# Patient Record
Sex: Female | Born: 1965
Health system: Southern US, Community
[De-identification: ages and names within clinical notes are randomized; demographics above are authoritative.]

## PROBLEM LIST (undated history)

## (undated) DIAGNOSIS — I10 Essential (primary) hypertension: Secondary | ICD-10-CM

## (undated) DIAGNOSIS — A809 Acute poliomyelitis, unspecified: Secondary | ICD-10-CM

## (undated) HISTORY — PX: LEG SURGERY: SHX1003

## (undated) HISTORY — PX: WRIST SURGERY: SHX841

---

## 2001-11-05 ENCOUNTER — Other Ambulatory Visit: Admission: RE | Admit: 2001-11-05 | Discharge: 2001-11-05 | Payer: Self-pay | Admitting: Internal Medicine

## 2005-10-18 ENCOUNTER — Ambulatory Visit: Payer: Self-pay | Admitting: Internal Medicine

## 2005-10-20 ENCOUNTER — Encounter: Admission: RE | Admit: 2005-10-20 | Discharge: 2005-10-20 | Payer: Self-pay | Admitting: Internal Medicine

## 2005-11-01 ENCOUNTER — Encounter: Admission: RE | Admit: 2005-11-01 | Discharge: 2005-11-01 | Payer: Self-pay | Admitting: Internal Medicine

## 2005-11-29 ENCOUNTER — Ambulatory Visit: Payer: Self-pay | Admitting: Internal Medicine

## 2005-11-29 ENCOUNTER — Other Ambulatory Visit: Admission: RE | Admit: 2005-11-29 | Discharge: 2005-11-29 | Payer: Self-pay | Admitting: Internal Medicine

## 2005-11-29 ENCOUNTER — Encounter: Payer: Self-pay | Admitting: Internal Medicine

## 2006-05-04 ENCOUNTER — Encounter: Admission: RE | Admit: 2006-05-04 | Discharge: 2006-05-04 | Payer: Self-pay | Admitting: Family Medicine

## 2006-07-28 ENCOUNTER — Ambulatory Visit: Payer: Self-pay | Admitting: Internal Medicine

## 2006-08-04 ENCOUNTER — Ambulatory Visit (HOSPITAL_COMMUNITY): Admission: RE | Admit: 2006-08-04 | Discharge: 2006-08-04 | Payer: Self-pay | Admitting: Internal Medicine

## 2006-09-15 ENCOUNTER — Inpatient Hospital Stay (HOSPITAL_COMMUNITY): Admission: EM | Admit: 2006-09-15 | Discharge: 2006-09-16 | Payer: Self-pay | Admitting: *Deleted

## 2006-10-06 ENCOUNTER — Ambulatory Visit: Payer: Self-pay | Admitting: Internal Medicine

## 2006-10-23 ENCOUNTER — Encounter: Admission: RE | Admit: 2006-10-23 | Discharge: 2006-10-23 | Payer: Self-pay | Admitting: Family Medicine

## 2007-01-05 ENCOUNTER — Ambulatory Visit: Payer: Self-pay | Admitting: Internal Medicine

## 2007-01-05 LAB — CONVERTED CEMR LAB
Cholesterol: 223 mg/dL — ABNORMAL HIGH (ref 0–200)
LDL Cholesterol: 137 mg/dL — ABNORMAL HIGH (ref 0–99)
TSH: 1.544 microintl units/mL (ref 0.350–5.50)
Triglycerides: 51 mg/dL (ref <150)
VLDL: 10 mg/dL (ref 0–40)

## 2007-01-17 ENCOUNTER — Ambulatory Visit: Payer: Self-pay | Admitting: Internal Medicine

## 2007-01-29 ENCOUNTER — Ambulatory Visit: Payer: Self-pay | Admitting: Internal Medicine

## 2007-01-29 ENCOUNTER — Encounter: Payer: Self-pay | Admitting: Internal Medicine

## 2007-01-29 LAB — CONVERTED CEMR LAB: Chlamydia, Swab/Urine, PCR: NEGATIVE

## 2007-04-08 ENCOUNTER — Emergency Department (HOSPITAL_COMMUNITY): Admission: EM | Admit: 2007-04-08 | Discharge: 2007-04-08 | Payer: Self-pay | Admitting: Emergency Medicine

## 2007-05-29 ENCOUNTER — Encounter: Admission: RE | Admit: 2007-05-29 | Discharge: 2007-05-29 | Payer: Self-pay | Admitting: Internal Medicine

## 2007-10-31 ENCOUNTER — Encounter: Admission: RE | Admit: 2007-10-31 | Discharge: 2007-10-31 | Payer: Self-pay | Admitting: Internal Medicine

## 2008-01-23 ENCOUNTER — Other Ambulatory Visit: Admission: RE | Admit: 2008-01-23 | Discharge: 2008-01-23 | Payer: Self-pay | Admitting: Internal Medicine

## 2008-01-23 ENCOUNTER — Ambulatory Visit: Payer: Self-pay | Admitting: Internal Medicine

## 2008-01-23 ENCOUNTER — Encounter: Payer: Self-pay | Admitting: Internal Medicine

## 2008-03-04 ENCOUNTER — Ambulatory Visit: Payer: Self-pay | Admitting: Internal Medicine

## 2008-10-31 ENCOUNTER — Encounter: Admission: RE | Admit: 2008-10-31 | Discharge: 2008-10-31 | Payer: Self-pay | Admitting: Internal Medicine

## 2009-03-23 ENCOUNTER — Ambulatory Visit: Payer: Self-pay | Admitting: Internal Medicine

## 2009-03-23 ENCOUNTER — Other Ambulatory Visit: Admission: RE | Admit: 2009-03-23 | Discharge: 2009-03-23 | Payer: Self-pay | Admitting: Internal Medicine

## 2009-03-24 ENCOUNTER — Encounter (INDEPENDENT_AMBULATORY_CARE_PROVIDER_SITE_OTHER): Payer: Self-pay | Admitting: Internal Medicine

## 2009-04-02 ENCOUNTER — Encounter (INDEPENDENT_AMBULATORY_CARE_PROVIDER_SITE_OTHER): Payer: Self-pay | Admitting: *Deleted

## 2009-09-02 ENCOUNTER — Ambulatory Visit: Payer: Self-pay | Admitting: Internal Medicine

## 2009-11-02 ENCOUNTER — Encounter: Admission: RE | Admit: 2009-11-02 | Discharge: 2009-11-02 | Payer: Self-pay | Admitting: Internal Medicine

## 2010-02-24 ENCOUNTER — Encounter (INDEPENDENT_AMBULATORY_CARE_PROVIDER_SITE_OTHER): Payer: Self-pay | Admitting: Internal Medicine

## 2010-02-24 ENCOUNTER — Other Ambulatory Visit: Admission: RE | Admit: 2010-02-24 | Discharge: 2010-02-24 | Payer: Self-pay | Admitting: Internal Medicine

## 2010-02-24 LAB — CONVERTED CEMR LAB
BUN: 17 mg/dL (ref 6–23)
CO2: 23 meq/L (ref 19–32)
Calcium: 9.2 mg/dL (ref 8.4–10.5)
Chloride: 105 meq/L (ref 96–112)
Creatinine, Ser: 0.63 mg/dL (ref 0.40–1.20)
Glucose, Bld: 98 mg/dL (ref 70–99)
Potassium: 4 meq/L (ref 3.5–5.3)

## 2010-08-24 NOTE — H&P (Signed)
NAME:  Krystal Li, Krystal Li NO.:  [id number]   MEDICAL RECORD NO.:  [id number]          PATIENT TYPE:  EMS   LOCATION:  [facility]                         FACILITY:  [facility]   PHYSICIAN:  Elliot Cousin, M.D.    DATE OF BIRTH:  1965-07-29   DATE OF ADMISSION:  09/14/2006  DATE OF DISCHARGE:                              HISTORY & PHYSICAL   PRIMARY CARE PHYSICIAN:  Dineen Kid. Reche Dixon, M.D., [facility].   CHIEF COMPLAINT:  Abdominal pain, nausea and vomiting.   HISTORY OF PRESENT ILLNESS:  The patient is a 45 year old woman with a  past medical history significant for polio, who presents to the  emergency department with a chief complaint of abdominal pain, nausea,  and vomiting.  The abdominal pain started approximately three days ago.  It is located primarily in the lower abdomen.  Initially, the pain was  severe; however, over the past 2-3 days, the pain has subsided but not  completely resolved.  She has had worsening nausea and vomiting over the  past 24 hours, numbering approximately 15 episodes.  She denies coffee-  ground emesis or bright red blood in her emesis.  She has had some  generalized weakness but no focal weakness worse than usual.  She denies  diarrhea and constipation.  She denies bright red blood per rectum or  black, tarry stools.  Her last bowel movement was two days ago and was  considered normal.  She has had subjective fever and chills.  She  denies painful urination and vaginal discharge.  Her last menstrual  period was approximately four days ago.  She is not sexually active on a  consistent basis.  She has not had any recent travel.  No recent  antibiotic treatment.  She denies any known sick contacts.   When the patient arrived to the emergency department, her heart rate was  in the 150s.  She received at least a liter of IV fluids, and now her  heart rate is ranging between 100-115 beats per minute.  Her lab data  are unremarkable at this  time; however, she will be admitted for further  evaluation and management.   PAST MEDICAL HISTORY:  1. Polio.  2. Headache.  3. Status post C-section.   MEDICATIONS:  1. Diclofenac 1 tablet b.i.d. p.r.n.  2. Midol 1 tablet as directed and as needed for menstrual cramps.  3. BC powders occasionally.   ALLERGIES:  No known drug allergies.   SOCIAL HISTORY:  The patient is single.  She has one child who is in  foster care.  She is totally disabled.  She ambulates with a cane.  She  denies tobacco, alcohol, and illicit drug use.   FAMILY HISTORY:  Her mother is 30 years of age and has a history of a  stroke and hypertension.  Her father is deceased, etiology unknown.  Her  brothers have a history of hypertension.   REVIEW OF SYSTEMS:  Patient's review of systems is positive for chronic  weakness in her legs, headaches, and menstrual cramps.   PHYSICAL EXAMINATION:  VITAL SIGNS:  Temperature 98.5, blood pressure  125/82, pulse rate 114, respiratory rate 18.  Oxygen saturation 99% on  room air.  GENERAL:  The patient is a pleasant 45 year old African-American woman  who is currently lying in bed in no acute distress.  HEENT:  Head is normocephalic and atraumatic.  Pupils are equal, round  and reactive to light.  Extraocular movements are intact.  Conjunctivae  are clear.  There is evidence of strabismus.  She also has mild  proptosis bilaterally.  Tympanic membranes are clear bilaterally.  Nasal  mucosa is mildly dry.  No sinus tenderness.  Oropharynx reveals fair  dentition.  Mucous membranes are mildly dry.  No posterior exudates or  erythema.  NECK:  Supple.  No adenopathy.  No thyromegaly.  No bruits.  No JVD.  LUNGS:  Clear to auscultation bilaterally.  HEART:  S1 and S2 with tachycardia.  ABDOMEN:  Hypoactive bowel sounds.  Mildly obese.  Only mildly tender in  the hypogastrium without rebound, guarding, or distention.  No masses  palpated.  No hepatosplenomegaly.   RECTAL/GU:  Deferred.  EXTREMITIES:  Pedal pulses 2+ bilaterally.  Mild atrophy of her lower  extremities bilaterally.  NEUROLOGIC:  Patient is alert and oriented x3.  Cranial nerves II-XII  appear to be grossly intact with the exception of lateral deviation of  the left eye, which is chronic.  Strength is 5/5 in the supine position  throughout.  Sensation is intact.   ADMISSION LABORATORIES:  Sodium 136, potassium 3.8, chloride 108,  glucose 134, BUN 25, bicarbonate 17, creatinine 1.  WBC 5.7, hemoglobin  15.9, hematocrit 47.5, platelets 416.  Urine pregnancy test negative.  Urinalysis:  Ketones 40, bilirubin moderate, nitrite negative, leukocyte  esterase negative.   ASSESSMENT:  1. Abdominal pain with intractable nausea and vomiting.  On exam, the      patient has only minimal abdominal tenderness with no associated      distention or peritonitis.  The nausea and vomiting appear to be      the most concerning symptoms for the patient.  The patient probably      has an acute bout of gastroenteritis.  2. Metabolic acidosis:  The patient's bicarbonate was found to be 17.      Given the patient's nausea and vomiting, metabolic alkalosis would      be expected.  Her creatinine and BUN are only marginally elevated.      She has not had any diarrhea, by her account.  The etiology of the      metabolic acidosis is unclear at this time.  3. Tachycardia:  The patient's tachycardia appears to be sinus.  An      EKG is pending.  With IV fluids, the tachycardia has improved.   PLAN:  1. Patient will be admitted for further evaluation and management.  2. Will check an abdominal x-ray, lipase, amylase, and lactic acid      level.  3. Supportive care with antiemetics, p.r.n. Zofran and Phenergan, and      scheduled Reglan.  4. Will start IV fluids with bicarbonate added.  5. Will check an EKG and TSH.  6. Will check liver transaminases as well.      Elliot Cousin, M.D.  Electronically  Signed     DF/MEDQ  D:  09/15/2006  T:  09/15/2006  Job:  [id number]   cc:   Dineen Kid. Reche Dixon, M.D.

## 2010-09-27 ENCOUNTER — Other Ambulatory Visit: Payer: Self-pay | Admitting: Internal Medicine

## 2010-09-27 DIAGNOSIS — Z1231 Encounter for screening mammogram for malignant neoplasm of breast: Secondary | ICD-10-CM

## 2010-11-05 ENCOUNTER — Ambulatory Visit
Admission: RE | Admit: 2010-11-05 | Discharge: 2010-11-05 | Disposition: A | Discharge status: Home / Self Care | Payer: Medicare Other | Source: Ambulatory Visit | Attending: Internal Medicine | Admitting: Internal Medicine

## 2010-11-05 DIAGNOSIS — Z1231 Encounter for screening mammogram for malignant neoplasm of breast: Secondary | ICD-10-CM

## 2011-01-27 LAB — URINE MICROSCOPIC-ADD ON

## 2011-01-27 LAB — PROTIME-INR
INR: 1.1
Prothrombin Time: 14.6

## 2011-01-27 LAB — DIFFERENTIAL
Eosinophils Absolute: 0
Lymphocytes Relative: 28
Lymphs Abs: 1.6
Lymphs Abs: 2.1
Monocytes Absolute: 0.6
Monocytes Absolute: 0.6
Monocytes Relative: 11
Monocytes Relative: 11
Neutro Abs: 3
Neutro Abs: 3.4
Neutrophils Relative %: 51
Neutrophils Relative %: 59

## 2011-01-27 LAB — URINALYSIS, ROUTINE W REFLEX MICROSCOPIC
Glucose, UA: NEGATIVE
Ketones, ur: 40 — AB
Protein, ur: 30 — AB
Urobilinogen, UA: 1

## 2011-01-27 LAB — COMPREHENSIVE METABOLIC PANEL
ALT: 25
Albumin: 3.8
Calcium: 8.9
Glucose, Bld: 124 — ABNORMAL HIGH
Sodium: 137
Total Protein: 6.9

## 2011-01-27 LAB — AMYLASE: Amylase: 68

## 2011-01-27 LAB — BASIC METABOLIC PANEL
BUN: 2 — ABNORMAL LOW
Calcium: 8.3 — ABNORMAL LOW
Creatinine, Ser: 0.61
GFR calc non Af Amer: >60

## 2011-01-27 LAB — I-STAT 8, (EC8 V) (CONVERTED LAB)
Acid-base deficit: 8 — ABNORMAL HIGH
Chloride: 108
HCT: 50 — ABNORMAL HIGH
Hemoglobin: 17 — ABNORMAL HIGH
Potassium: 3.8
Sodium: 136
TCO2: 18
pH, Ven: 7.323 — ABNORMAL HIGH

## 2011-01-27 LAB — CBC
Hemoglobin: 13.9
MCHC: 33.8
MCV: 95.7
Platelets: 243
Platelets: 345
RBC: 4.96
RDW: 12.4
WBC: 4

## 2011-01-27 LAB — PREGNANCY, URINE: Preg Test, Ur: NEGATIVE

## 2011-01-27 LAB — TSH: TSH: 2.835

## 2011-01-27 LAB — POCT I-STAT CREATININE: Operator id: 265961

## 2011-01-27 LAB — LACTIC ACID, PLASMA: Lactic Acid, Venous: 1

## 2011-05-01 ENCOUNTER — Other Ambulatory Visit (HOSPITAL_COMMUNITY)
Admission: RE | Admit: 2011-05-01 | Discharge: 2011-05-01 | Disposition: A | Discharge status: Home / Self Care | Payer: Medicare Other | Source: Ambulatory Visit | Attending: Family Medicine | Admitting: Family Medicine

## 2011-05-01 DIAGNOSIS — Z01419 Encounter for gynecological examination (general) (routine) without abnormal findings: Secondary | ICD-10-CM | POA: Diagnosis not present

## 2011-05-11 ENCOUNTER — Other Ambulatory Visit: Payer: Self-pay | Admitting: Internal Medicine

## 2011-05-11 DIAGNOSIS — M129 Arthropathy, unspecified: Secondary | ICD-10-CM | POA: Diagnosis not present

## 2011-05-11 DIAGNOSIS — I10 Essential (primary) hypertension: Secondary | ICD-10-CM | POA: Diagnosis not present

## 2011-05-12 ENCOUNTER — Other Ambulatory Visit (HOSPITAL_COMMUNITY): Payer: Self-pay | Admitting: Family Medicine

## 2011-05-12 DIAGNOSIS — Z1231 Encounter for screening mammogram for malignant neoplasm of breast: Secondary | ICD-10-CM

## 2011-06-09 DIAGNOSIS — I10 Essential (primary) hypertension: Secondary | ICD-10-CM | POA: Diagnosis not present

## 2011-09-21 DIAGNOSIS — B91 Sequelae of poliomyelitis: Secondary | ICD-10-CM | POA: Diagnosis not present

## 2011-10-14 DIAGNOSIS — I10 Essential (primary) hypertension: Secondary | ICD-10-CM | POA: Diagnosis not present

## 2011-10-14 DIAGNOSIS — Z7251 High risk heterosexual behavior: Secondary | ICD-10-CM | POA: Diagnosis not present

## 2011-10-14 DIAGNOSIS — N9489 Other specified conditions associated with female genital organs and menstrual cycle: Secondary | ICD-10-CM | POA: Diagnosis not present

## 2011-10-14 DIAGNOSIS — B91 Sequelae of poliomyelitis: Secondary | ICD-10-CM | POA: Diagnosis not present

## 2011-11-07 ENCOUNTER — Ambulatory Visit (HOSPITAL_COMMUNITY)
Admission: RE | Admit: 2011-11-07 | Discharge: 2011-11-07 | Disposition: A | Discharge status: Home / Self Care | Payer: Medicare Other | Source: Ambulatory Visit | Attending: Family Medicine | Admitting: Family Medicine

## 2011-11-07 DIAGNOSIS — Z1231 Encounter for screening mammogram for malignant neoplasm of breast: Secondary | ICD-10-CM

## 2012-01-31 DIAGNOSIS — E559 Vitamin D deficiency, unspecified: Secondary | ICD-10-CM | POA: Diagnosis not present

## 2012-01-31 DIAGNOSIS — B91 Sequelae of poliomyelitis: Secondary | ICD-10-CM | POA: Diagnosis not present

## 2012-06-07 ENCOUNTER — Emergency Department (HOSPITAL_COMMUNITY): Payer: Medicare Other

## 2012-06-07 ENCOUNTER — Encounter (HOSPITAL_COMMUNITY): Payer: Self-pay | Admitting: Emergency Medicine

## 2012-06-07 ENCOUNTER — Emergency Department (HOSPITAL_COMMUNITY)
Admission: EM | Admit: 2012-06-07 | Discharge: 2012-06-08 | Disposition: A | Discharge status: Home / Self Care | Payer: Medicare Other | Attending: Emergency Medicine | Admitting: Emergency Medicine

## 2012-06-07 DIAGNOSIS — R112 Nausea with vomiting, unspecified: Secondary | ICD-10-CM | POA: Insufficient documentation

## 2012-06-07 DIAGNOSIS — J3489 Other specified disorders of nose and nasal sinuses: Secondary | ICD-10-CM | POA: Insufficient documentation

## 2012-06-07 DIAGNOSIS — Z79899 Other long term (current) drug therapy: Secondary | ICD-10-CM | POA: Insufficient documentation

## 2012-06-07 DIAGNOSIS — R6889 Other general symptoms and signs: Secondary | ICD-10-CM | POA: Insufficient documentation

## 2012-06-07 DIAGNOSIS — Z8619 Personal history of other infectious and parasitic diseases: Secondary | ICD-10-CM | POA: Insufficient documentation

## 2012-06-07 DIAGNOSIS — R05 Cough: Secondary | ICD-10-CM | POA: Insufficient documentation

## 2012-06-07 DIAGNOSIS — R109 Unspecified abdominal pain: Secondary | ICD-10-CM | POA: Insufficient documentation

## 2012-06-07 DIAGNOSIS — R197 Diarrhea, unspecified: Secondary | ICD-10-CM | POA: Diagnosis not present

## 2012-06-07 DIAGNOSIS — E860 Dehydration: Secondary | ICD-10-CM | POA: Diagnosis not present

## 2012-06-07 DIAGNOSIS — R059 Cough, unspecified: Secondary | ICD-10-CM | POA: Diagnosis not present

## 2012-06-07 DIAGNOSIS — I10 Essential (primary) hypertension: Secondary | ICD-10-CM | POA: Diagnosis not present

## 2012-06-07 DIAGNOSIS — R1110 Vomiting, unspecified: Secondary | ICD-10-CM

## 2012-06-07 HISTORY — DX: Essential (primary) hypertension: I10

## 2012-06-07 HISTORY — DX: Acute poliomyelitis, unspecified: A80.9

## 2012-06-07 LAB — URINE MICROSCOPIC-ADD ON

## 2012-06-07 LAB — COMPREHENSIVE METABOLIC PANEL
ALT: 55 U/L — ABNORMAL HIGH (ref 0–35)
Albumin: 4.1 g/dL (ref 3.5–5.2)
Alkaline Phosphatase: 117 U/L (ref 39–117)
BUN: 15 mg/dL (ref 6–23)
Calcium: 9.6 mg/dL (ref 8.4–10.5)
GFR calc Af Amer: >90 mL/min (ref >90)
Glucose, Bld: 159 mg/dL — ABNORMAL HIGH (ref 70–99)
Potassium: 3.5 mEq/L (ref 3.5–5.1)
Sodium: 135 mEq/L (ref 135–145)
Total Protein: 7.8 g/dL (ref 6.0–8.3)

## 2012-06-07 LAB — CBC WITH DIFFERENTIAL/PLATELET
Basophils Relative: 1 % (ref 0–1)
Eosinophils Absolute: 0.1 10*3/uL (ref 0.0–0.7)
Eosinophils Relative: 1 % (ref 0–5)
HCT: 39.4 % (ref 36.0–46.0)
Hemoglobin: 13.9 g/dL (ref 12.0–15.0)
Lymphs Abs: 1.4 10*3/uL (ref 0.7–4.0)
MCH: 32.2 pg (ref 26.0–34.0)
MCHC: 35.3 g/dL (ref 30.0–36.0)
MCV: 91.2 fL (ref 78.0–100.0)
Neutrophils Relative %: 65 % (ref 43–77)
Platelets: 303 10*3/uL (ref 150–400)
RBC: 4.32 MIL/uL (ref 3.87–5.11)
WBC: 5.4 10*3/uL (ref 4.0–10.5)

## 2012-06-07 LAB — URINALYSIS, ROUTINE W REFLEX MICROSCOPIC
Glucose, UA: NEGATIVE mg/dL
Leukocytes, UA: NEGATIVE
Nitrite: NEGATIVE
Specific Gravity, Urine: 1.03 (ref 1.005–1.030)
pH: 5 (ref 5.0–8.0)

## 2012-06-07 MED ORDER — METOCLOPRAMIDE HCL 10 MG PO TABS: 10.0000 mg | ORAL_TABLET | Freq: Four times a day (QID) | ORAL | Status: AC | PRN

## 2012-06-07 MED ORDER — METOCLOPRAMIDE HCL 5 MG/ML IJ SOLN
10.0000 mg | Freq: Once | INTRAMUSCULAR | Status: AC
  Administered 2012-06-07: 10 mg via INTRAVENOUS
  Filled 2012-06-07: qty 2

## 2012-06-07 MED ORDER — SODIUM CHLORIDE 0.9 % IV BOLUS (SEPSIS)
2000.0000 mL | Freq: Once | INTRAVENOUS | Status: AC
Start: 2012-06-07 — End: 2012-06-08
  Administered 2012-06-07: 2000 mL via INTRAVENOUS

## 2012-06-07 MED ORDER — IPRATROPIUM BROMIDE 0.02 % IN SOLN
0.5000 mg | Freq: Once | RESPIRATORY_TRACT | Status: AC
  Administered 2012-06-07: 0.5 mg via RESPIRATORY_TRACT
  Filled 2012-06-07: qty 2.5

## 2012-06-07 MED ORDER — ALBUTEROL SULFATE HFA 108 (90 BASE) MCG/ACT IN AERS: 1.0000 | INHALATION_SPRAY | Freq: Four times a day (QID) | RESPIRATORY_TRACT | Status: AC | PRN

## 2012-06-07 MED ORDER — ALBUTEROL SULFATE (5 MG/ML) 0.5% IN NEBU
5.0000 mg | INHALATION_SOLUTION | Freq: Once | RESPIRATORY_TRACT | Status: AC
  Administered 2012-06-07: 5 mg via RESPIRATORY_TRACT
  Filled 2012-06-07: qty 1

## 2012-06-07 NOTE — ED Provider Notes (Signed)
History     CSN: [id number]  Arrival date & time 06/07/12  2145   First MD Initiated Contact with Patient 06/07/12 2232      Chief Complaint  Patient presents with  . Emesis  . Cough    (Consider location/radiation/quality/duration/timing/severity/associated sxs/prior treatment) The history is provided by the patient.  Krystal Li is a 47 y.o. female history of hypertension here presenting with abdominal pain and nausea vomiting and diarrhea. Diarrhea for the last 2 weeks. For the last week she has intermittent nausea and vomiting. She ate some french fries yesterday and will vomited up. She also has some nonproductive cough as well as nasal congestion and runny nose. Denies any chest pain or shortness of breath. She just finished her menstrual period.    Past Medical History  Diagnosis Date  . Hypertension   . Polio     Past Surgical History  Procedure Laterality Date  . Leg surgery      No family history on file.  History  Substance Use Topics  . Smoking status: Never Smoker   . Smokeless tobacco: Not on file  . Alcohol Use: No    OB History   Grav Para Term Preterm Abortions TAB SAB Ect Mult Living                  Review of Systems  Respiratory: Positive for cough.   Gastrointestinal: Positive for vomiting and abdominal pain.  All other systems reviewed and are negative.    Allergies  Review of patient's allergies indicates no known allergies.  Home Medications   Current Outpatient Rx  Name  Route  Sig  Dispense  Refill  . cholecalciferol (VITAMIN D) 1000 UNITS tablet   Oral   Take 1,000 Units by mouth daily.         . ferrous sulfate 325 (65 FE) MG tablet   Oral   Take 325 mg by mouth daily with breakfast.         . lisinopril (PRINIVIL,ZESTRIL) 20 MG tablet   Oral   Take 20 mg by mouth daily.         . pravastatin (PRAVACHOL) 20 MG tablet   Oral   Take 20 mg by mouth daily.         Marland Kitchen albuterol (PROVENTIL HFA;VENTOLIN HFA) 108  (90 BASE) MCG/ACT inhaler   Inhalation   Inhale 1-2 puffs into the lungs every 6 (six) hours as needed for wheezing.   1 Inhaler   0   . metoCLOPramide (REGLAN) 10 MG tablet   Oral   Take 1 tablet (10 mg total) by mouth every 6 (six) hours as needed (nausea/headache).   15 tablet   0     BP 147/103  Pulse 112  Temp(Src) 98.2 F (36.8 C) (Oral)  Resp 20  SpO2 99%  LMP 05/30/2012  Physical Exam  Nursing note and vitals reviewed. Constitutional: She is oriented to person, place, and time. She appears well-developed and well-nourished.  Anxious, coughing   HENT:  Head: Normocephalic.  MM dry   Eyes: Conjunctivae are normal. Pupils are equal, round, and reactive to light.  Neck: Normal range of motion. Neck supple.  Cardiovascular: Regular rhythm and normal heart sounds.   +tachycardic   Pulmonary/Chest: Effort normal and breath sounds normal. No respiratory distress. She has no wheezes. She has no rales.  Abdominal: Soft. Bowel sounds are normal. She exhibits no distension. There is no tenderness. There is no rebound.  Musculoskeletal: Normal  range of motion.  Neurological: She is alert and oriented to person, place, and time.  Skin: Skin is warm and dry.  Psychiatric: She has a normal mood and affect. Her behavior is normal. Judgment and thought content normal.    ED Course  Procedures (including critical care time)  Labs Reviewed  COMPREHENSIVE METABOLIC PANEL - Abnormal; Notable for the following:    Glucose, Bld 159 (*)    Creatinine, Ser 0.48 (*)    ALT 55 (*)    All other components within normal limits  URINALYSIS, ROUTINE W REFLEX MICROSCOPIC - Abnormal; Notable for the following:    Color, Urine AMBER (*)    APPearance CLOUDY (*)    Hgb urine dipstick LARGE (*)    Bilirubin Urine SMALL (*)    Ketones, ur 40 (*)    All other components within normal limits  URINE MICROSCOPIC-ADD ON - Abnormal; Notable for the following:    Squamous Epithelial / LPF FEW  (*)    Bacteria, UA MANY (*)    All other components within normal limits  URINE CULTURE  CBC WITH DIFFERENTIAL  POCT PREGNANCY, URINE   Dg Chest 2 View  06/07/2012  *RADIOLOGY REPORT*  Clinical Data: Cough and congestion.  CHEST - 2 VIEW  Comparison: None  Findings: The cardiomediastinal silhouette is unremarkable. The lungs are clear. There is no evidence of focal airspace disease, pulmonary edema, suspicious pulmonary nodule/mass, pleural effusion, or pneumothorax. No acute bony abnormalities are identified.  IMPRESSION: No evidence of active cardiopulmonary disease.   Original Report Authenticated By: Harmon Pier, M.D.      1. Vomiting   2. Dehydration   3. Cough       MDM  Krystal Li is a 47 y.o. female here with vomiting, cough. I think she likely has viral gastroenteritis. Will hydrate patient, give antiemetic and reassess.   11:34 PM Xray chest nl. UA contaminated and she is not having symptoms, there is some hematuria but she just got done with her period. Labs otherwise unremarkable. Will hydrate and reassess. I signed out to Dr. Norlene Campbell to reassess the patient.        Richardean Canal, MD 06/07/12 [phone]

## 2012-06-07 NOTE — ED Notes (Signed)
PT. REPORTS EMESIS , DRY COUGH , HEADACHE AND NASAL CONGESTION / RUNNY NOSE FOR SEVERAL DAYS .

## 2012-06-07 NOTE — ED Notes (Signed)
Received report from Kelly RN

## 2012-06-08 NOTE — ED Notes (Signed)
Patient tolerated ginger ale with no problems.  Dr. Norlene Campbell notified.

## 2012-06-09 LAB — URINE CULTURE

## 2012-06-14 DIAGNOSIS — G43909 Migraine, unspecified, not intractable, without status migrainosus: Secondary | ICD-10-CM | POA: Diagnosis not present

## 2012-06-14 DIAGNOSIS — I10 Essential (primary) hypertension: Secondary | ICD-10-CM | POA: Diagnosis not present

## 2012-08-14 DIAGNOSIS — I119 Hypertensive heart disease without heart failure: Secondary | ICD-10-CM | POA: Diagnosis not present

## 2012-09-11 DIAGNOSIS — I10 Essential (primary) hypertension: Secondary | ICD-10-CM | POA: Diagnosis not present

## 2012-09-11 DIAGNOSIS — B91 Sequelae of poliomyelitis: Secondary | ICD-10-CM | POA: Diagnosis not present

## 2012-10-15 ENCOUNTER — Other Ambulatory Visit: Payer: Self-pay | Admitting: Family Medicine

## 2012-10-15 DIAGNOSIS — Z1231 Encounter for screening mammogram for malignant neoplasm of breast: Secondary | ICD-10-CM

## 2012-10-19 ENCOUNTER — Other Ambulatory Visit: Payer: Self-pay | Admitting: Family Medicine

## 2012-10-19 ENCOUNTER — Ambulatory Visit (HOSPITAL_COMMUNITY)
Admission: RE | Admit: 2012-10-19 | Discharge: 2012-10-19 | Disposition: A | Discharge status: Home / Self Care | Payer: Medicare Other | Source: Ambulatory Visit | Attending: Family Medicine | Admitting: Family Medicine

## 2012-10-19 DIAGNOSIS — Z1231 Encounter for screening mammogram for malignant neoplasm of breast: Secondary | ICD-10-CM

## 2012-11-07 ENCOUNTER — Ambulatory Visit (HOSPITAL_COMMUNITY): Payer: Medicare Other

## 2012-11-19 ENCOUNTER — Other Ambulatory Visit (HOSPITAL_COMMUNITY): Payer: Self-pay | Admitting: Internal Medicine

## 2012-11-19 DIAGNOSIS — Z1231 Encounter for screening mammogram for malignant neoplasm of breast: Secondary | ICD-10-CM

## 2012-11-29 ENCOUNTER — Ambulatory Visit (HOSPITAL_COMMUNITY): Payer: Medicare Other

## 2012-12-17 ENCOUNTER — Ambulatory Visit (HOSPITAL_COMMUNITY)
Admission: RE | Admit: 2012-12-17 | Discharge: 2012-12-17 | Disposition: A | Discharge status: Home / Self Care | Payer: Medicare Other | Source: Ambulatory Visit | Attending: Internal Medicine | Admitting: Internal Medicine

## 2012-12-17 DIAGNOSIS — Z1231 Encounter for screening mammogram for malignant neoplasm of breast: Secondary | ICD-10-CM | POA: Insufficient documentation

## 2012-12-20 DIAGNOSIS — I10 Essential (primary) hypertension: Secondary | ICD-10-CM | POA: Diagnosis not present

## 2012-12-20 DIAGNOSIS — N898 Other specified noninflammatory disorders of vagina: Secondary | ICD-10-CM | POA: Diagnosis not present

## 2012-12-20 DIAGNOSIS — Z23 Encounter for immunization: Secondary | ICD-10-CM | POA: Diagnosis not present

## 2012-12-20 DIAGNOSIS — Z124 Encounter for screening for malignant neoplasm of cervix: Secondary | ICD-10-CM | POA: Diagnosis not present

## 2013-04-09 DIAGNOSIS — B91 Sequelae of poliomyelitis: Secondary | ICD-10-CM | POA: Diagnosis not present

## 2013-04-09 DIAGNOSIS — G43909 Migraine, unspecified, not intractable, without status migrainosus: Secondary | ICD-10-CM | POA: Diagnosis not present

## 2013-04-09 DIAGNOSIS — I119 Hypertensive heart disease without heart failure: Secondary | ICD-10-CM | POA: Diagnosis not present

## 2013-07-04 DIAGNOSIS — Z8612 Personal history of poliomyelitis: Secondary | ICD-10-CM | POA: Diagnosis not present

## 2013-07-04 DIAGNOSIS — E559 Vitamin D deficiency, unspecified: Secondary | ICD-10-CM | POA: Diagnosis not present

## 2013-07-04 DIAGNOSIS — G43909 Migraine, unspecified, not intractable, without status migrainosus: Secondary | ICD-10-CM | POA: Diagnosis not present

## 2013-07-04 DIAGNOSIS — E785 Hyperlipidemia, unspecified: Secondary | ICD-10-CM | POA: Diagnosis not present

## 2013-07-04 DIAGNOSIS — N920 Excessive and frequent menstruation with regular cycle: Secondary | ICD-10-CM | POA: Diagnosis not present

## 2013-07-04 DIAGNOSIS — D869 Sarcoidosis, unspecified: Secondary | ICD-10-CM | POA: Diagnosis not present

## 2013-07-04 DIAGNOSIS — I119 Hypertensive heart disease without heart failure: Secondary | ICD-10-CM | POA: Diagnosis not present

## 2013-07-04 DIAGNOSIS — B91 Sequelae of poliomyelitis: Secondary | ICD-10-CM | POA: Diagnosis not present

## 2013-07-04 DIAGNOSIS — I10 Essential (primary) hypertension: Secondary | ICD-10-CM | POA: Diagnosis not present

## 2013-08-15 DIAGNOSIS — N920 Excessive and frequent menstruation with regular cycle: Secondary | ICD-10-CM | POA: Diagnosis not present

## 2013-08-15 DIAGNOSIS — I10 Essential (primary) hypertension: Secondary | ICD-10-CM | POA: Diagnosis not present

## 2013-08-15 DIAGNOSIS — I119 Hypertensive heart disease without heart failure: Secondary | ICD-10-CM | POA: Diagnosis not present

## 2013-08-15 DIAGNOSIS — E785 Hyperlipidemia, unspecified: Secondary | ICD-10-CM | POA: Diagnosis not present

## 2013-08-15 DIAGNOSIS — Z8612 Personal history of poliomyelitis: Secondary | ICD-10-CM | POA: Diagnosis not present

## 2013-08-15 DIAGNOSIS — G43909 Migraine, unspecified, not intractable, without status migrainosus: Secondary | ICD-10-CM | POA: Diagnosis not present

## 2013-08-15 DIAGNOSIS — B91 Sequelae of poliomyelitis: Secondary | ICD-10-CM | POA: Diagnosis not present

## 2013-08-15 DIAGNOSIS — E559 Vitamin D deficiency, unspecified: Secondary | ICD-10-CM | POA: Diagnosis not present

## 2013-09-05 ENCOUNTER — Other Ambulatory Visit (HOSPITAL_COMMUNITY): Payer: Self-pay | Admitting: Internal Medicine

## 2013-09-05 DIAGNOSIS — Z1231 Encounter for screening mammogram for malignant neoplasm of breast: Secondary | ICD-10-CM

## 2013-11-15 DIAGNOSIS — I119 Hypertensive heart disease without heart failure: Secondary | ICD-10-CM | POA: Diagnosis not present

## 2013-11-15 DIAGNOSIS — Z8612 Personal history of poliomyelitis: Secondary | ICD-10-CM | POA: Diagnosis not present

## 2013-11-15 DIAGNOSIS — I10 Essential (primary) hypertension: Secondary | ICD-10-CM | POA: Diagnosis not present

## 2013-11-15 DIAGNOSIS — N920 Excessive and frequent menstruation with regular cycle: Secondary | ICD-10-CM | POA: Diagnosis not present

## 2013-11-15 DIAGNOSIS — E785 Hyperlipidemia, unspecified: Secondary | ICD-10-CM | POA: Diagnosis not present

## 2013-11-15 DIAGNOSIS — E559 Vitamin D deficiency, unspecified: Secondary | ICD-10-CM | POA: Diagnosis not present

## 2013-11-15 DIAGNOSIS — G43909 Migraine, unspecified, not intractable, without status migrainosus: Secondary | ICD-10-CM | POA: Diagnosis not present

## 2013-11-15 DIAGNOSIS — B91 Sequelae of poliomyelitis: Secondary | ICD-10-CM | POA: Diagnosis not present

## 2013-12-11 DIAGNOSIS — N898 Other specified noninflammatory disorders of vagina: Secondary | ICD-10-CM | POA: Diagnosis not present

## 2013-12-11 DIAGNOSIS — I10 Essential (primary) hypertension: Secondary | ICD-10-CM | POA: Diagnosis not present

## 2013-12-11 DIAGNOSIS — Z124 Encounter for screening for malignant neoplasm of cervix: Secondary | ICD-10-CM | POA: Diagnosis not present

## 2013-12-11 DIAGNOSIS — N760 Acute vaginitis: Secondary | ICD-10-CM | POA: Diagnosis not present

## 2013-12-11 DIAGNOSIS — I119 Hypertensive heart disease without heart failure: Secondary | ICD-10-CM | POA: Diagnosis not present

## 2013-12-11 DIAGNOSIS — N390 Urinary tract infection, site not specified: Secondary | ICD-10-CM | POA: Diagnosis not present

## 2013-12-11 DIAGNOSIS — E785 Hyperlipidemia, unspecified: Secondary | ICD-10-CM | POA: Diagnosis not present

## 2013-12-11 DIAGNOSIS — G43909 Migraine, unspecified, not intractable, without status migrainosus: Secondary | ICD-10-CM | POA: Diagnosis not present

## 2013-12-11 DIAGNOSIS — E559 Vitamin D deficiency, unspecified: Secondary | ICD-10-CM | POA: Diagnosis not present

## 2013-12-23 ENCOUNTER — Ambulatory Visit (HOSPITAL_COMMUNITY): Payer: Medicare Other

## 2013-12-31 ENCOUNTER — Ambulatory Visit (HOSPITAL_COMMUNITY)
Admission: RE | Admit: 2013-12-31 | Discharge: 2013-12-31 | Disposition: A | Discharge status: Home / Self Care | Payer: Medicare Other | Source: Ambulatory Visit | Attending: Internal Medicine | Admitting: Internal Medicine

## 2013-12-31 DIAGNOSIS — Z1231 Encounter for screening mammogram for malignant neoplasm of breast: Secondary | ICD-10-CM | POA: Diagnosis not present

## 2014-02-07 DIAGNOSIS — Z23 Encounter for immunization: Secondary | ICD-10-CM | POA: Diagnosis not present

## 2014-02-10 DIAGNOSIS — I10 Essential (primary) hypertension: Secondary | ICD-10-CM | POA: Diagnosis not present

## 2014-02-10 DIAGNOSIS — Z3009 Encounter for other general counseling and advice on contraception: Secondary | ICD-10-CM | POA: Diagnosis not present

## 2014-02-10 DIAGNOSIS — Z23 Encounter for immunization: Secondary | ICD-10-CM | POA: Diagnosis not present

## 2014-02-10 DIAGNOSIS — E559 Vitamin D deficiency, unspecified: Secondary | ICD-10-CM | POA: Diagnosis not present

## 2014-02-10 DIAGNOSIS — E785 Hyperlipidemia, unspecified: Secondary | ICD-10-CM | POA: Diagnosis not present

## 2014-02-10 DIAGNOSIS — I119 Hypertensive heart disease without heart failure: Secondary | ICD-10-CM | POA: Diagnosis not present

## 2014-02-10 DIAGNOSIS — R269 Unspecified abnormalities of gait and mobility: Secondary | ICD-10-CM | POA: Diagnosis not present

## 2014-02-10 DIAGNOSIS — R21 Rash and other nonspecific skin eruption: Secondary | ICD-10-CM | POA: Diagnosis not present

## 2014-03-18 DIAGNOSIS — L853 Xerosis cutis: Secondary | ICD-10-CM | POA: Diagnosis not present

## 2014-03-18 DIAGNOSIS — L299 Pruritus, unspecified: Secondary | ICD-10-CM | POA: Diagnosis not present

## 2014-03-18 DIAGNOSIS — I119 Hypertensive heart disease without heart failure: Secondary | ICD-10-CM | POA: Diagnosis not present

## 2014-03-18 DIAGNOSIS — I10 Essential (primary) hypertension: Secondary | ICD-10-CM | POA: Diagnosis not present

## 2014-03-18 DIAGNOSIS — E785 Hyperlipidemia, unspecified: Secondary | ICD-10-CM | POA: Diagnosis not present

## 2014-03-18 DIAGNOSIS — H539 Unspecified visual disturbance: Secondary | ICD-10-CM | POA: Diagnosis not present

## 2014-03-18 DIAGNOSIS — A8030 Acute paralytic poliomyelitis, unspecified: Secondary | ICD-10-CM | POA: Diagnosis not present

## 2014-03-18 DIAGNOSIS — E559 Vitamin D deficiency, unspecified: Secondary | ICD-10-CM | POA: Diagnosis not present

## 2014-04-18 DIAGNOSIS — L299 Pruritus, unspecified: Secondary | ICD-10-CM | POA: Diagnosis not present

## 2014-04-18 DIAGNOSIS — R2681 Unsteadiness on feet: Secondary | ICD-10-CM | POA: Diagnosis not present

## 2014-04-18 DIAGNOSIS — R739 Hyperglycemia, unspecified: Secondary | ICD-10-CM | POA: Diagnosis not present

## 2014-04-18 DIAGNOSIS — I119 Hypertensive heart disease without heart failure: Secondary | ICD-10-CM | POA: Diagnosis not present

## 2014-04-18 DIAGNOSIS — E559 Vitamin D deficiency, unspecified: Secondary | ICD-10-CM | POA: Diagnosis not present

## 2014-04-18 DIAGNOSIS — E785 Hyperlipidemia, unspecified: Secondary | ICD-10-CM | POA: Diagnosis not present

## 2014-04-18 DIAGNOSIS — Z136 Encounter for screening for cardiovascular disorders: Secondary | ICD-10-CM | POA: Diagnosis not present

## 2014-04-18 DIAGNOSIS — L853 Xerosis cutis: Secondary | ICD-10-CM | POA: Diagnosis not present

## 2014-04-18 DIAGNOSIS — Z01118 Encounter for examination of ears and hearing with other abnormal findings: Secondary | ICD-10-CM | POA: Diagnosis not present

## 2014-04-18 DIAGNOSIS — Z113 Encounter for screening for infections with a predominantly sexual mode of transmission: Secondary | ICD-10-CM | POA: Diagnosis not present

## 2014-04-18 DIAGNOSIS — Z0000 Encounter for general adult medical examination without abnormal findings: Secondary | ICD-10-CM | POA: Diagnosis not present

## 2014-04-18 DIAGNOSIS — Z1389 Encounter for screening for other disorder: Secondary | ICD-10-CM | POA: Diagnosis not present

## 2014-04-18 DIAGNOSIS — I10 Essential (primary) hypertension: Secondary | ICD-10-CM | POA: Diagnosis not present

## 2014-05-15 DIAGNOSIS — R2681 Unsteadiness on feet: Secondary | ICD-10-CM | POA: Diagnosis not present

## 2014-05-15 DIAGNOSIS — I10 Essential (primary) hypertension: Secondary | ICD-10-CM | POA: Diagnosis not present

## 2014-05-15 DIAGNOSIS — E785 Hyperlipidemia, unspecified: Secondary | ICD-10-CM | POA: Diagnosis not present

## 2014-05-15 DIAGNOSIS — I119 Hypertensive heart disease without heart failure: Secondary | ICD-10-CM | POA: Diagnosis not present

## 2014-05-15 DIAGNOSIS — E559 Vitamin D deficiency, unspecified: Secondary | ICD-10-CM | POA: Diagnosis not present

## 2014-05-15 DIAGNOSIS — M25532 Pain in left wrist: Secondary | ICD-10-CM | POA: Diagnosis not present

## 2014-08-19 DIAGNOSIS — E559 Vitamin D deficiency, unspecified: Secondary | ICD-10-CM | POA: Diagnosis not present

## 2014-08-19 DIAGNOSIS — E785 Hyperlipidemia, unspecified: Secondary | ICD-10-CM | POA: Diagnosis not present

## 2014-08-19 DIAGNOSIS — R2681 Unsteadiness on feet: Secondary | ICD-10-CM | POA: Diagnosis not present

## 2014-08-19 DIAGNOSIS — I119 Hypertensive heart disease without heart failure: Secondary | ICD-10-CM | POA: Diagnosis not present

## 2014-08-19 DIAGNOSIS — N920 Excessive and frequent menstruation with regular cycle: Secondary | ICD-10-CM | POA: Diagnosis not present

## 2014-08-19 DIAGNOSIS — I10 Essential (primary) hypertension: Secondary | ICD-10-CM | POA: Diagnosis not present

## 2014-09-01 DIAGNOSIS — I119 Hypertensive heart disease without heart failure: Secondary | ICD-10-CM | POA: Diagnosis not present

## 2014-09-01 DIAGNOSIS — R2681 Unsteadiness on feet: Secondary | ICD-10-CM | POA: Diagnosis not present

## 2014-09-01 DIAGNOSIS — E559 Vitamin D deficiency, unspecified: Secondary | ICD-10-CM | POA: Diagnosis not present

## 2014-09-01 DIAGNOSIS — N939 Abnormal uterine and vaginal bleeding, unspecified: Secondary | ICD-10-CM | POA: Diagnosis not present

## 2014-09-01 DIAGNOSIS — N920 Excessive and frequent menstruation with regular cycle: Secondary | ICD-10-CM | POA: Diagnosis not present

## 2014-09-01 DIAGNOSIS — J309 Allergic rhinitis, unspecified: Secondary | ICD-10-CM | POA: Diagnosis not present

## 2014-09-01 DIAGNOSIS — E785 Hyperlipidemia, unspecified: Secondary | ICD-10-CM | POA: Diagnosis not present

## 2014-09-01 DIAGNOSIS — L309 Dermatitis, unspecified: Secondary | ICD-10-CM | POA: Diagnosis not present

## 2014-09-01 DIAGNOSIS — I10 Essential (primary) hypertension: Secondary | ICD-10-CM | POA: Diagnosis not present

## 2014-09-02 ENCOUNTER — Other Ambulatory Visit: Payer: Self-pay | Admitting: Physician Assistant

## 2014-09-02 DIAGNOSIS — N921 Excessive and frequent menstruation with irregular cycle: Secondary | ICD-10-CM

## 2014-09-04 ENCOUNTER — Other Ambulatory Visit: Payer: Medicare Other

## 2014-09-05 ENCOUNTER — Ambulatory Visit
Admission: RE | Admit: 2014-09-05 | Discharge: 2014-09-05 | Disposition: A | Discharge status: Home / Self Care | Payer: Medicare Other | Source: Ambulatory Visit | Attending: Physician Assistant | Admitting: Physician Assistant

## 2014-09-05 DIAGNOSIS — N921 Excessive and frequent menstruation with irregular cycle: Secondary | ICD-10-CM

## 2014-09-05 DIAGNOSIS — N920 Excessive and frequent menstruation with regular cycle: Secondary | ICD-10-CM | POA: Diagnosis not present

## 2014-09-26 DIAGNOSIS — M654 Radial styloid tenosynovitis [de Quervain]: Secondary | ICD-10-CM | POA: Diagnosis not present

## 2014-10-20 DIAGNOSIS — R2681 Unsteadiness on feet: Secondary | ICD-10-CM | POA: Diagnosis not present

## 2014-10-20 DIAGNOSIS — J3081 Allergic rhinitis due to animal (cat) (dog) hair and dander: Secondary | ICD-10-CM | POA: Diagnosis not present

## 2014-10-20 DIAGNOSIS — I119 Hypertensive heart disease without heart failure: Secondary | ICD-10-CM | POA: Diagnosis not present

## 2014-10-20 DIAGNOSIS — L309 Dermatitis, unspecified: Secondary | ICD-10-CM | POA: Diagnosis not present

## 2014-10-20 DIAGNOSIS — L298 Other pruritus: Secondary | ICD-10-CM | POA: Diagnosis not present

## 2014-10-20 DIAGNOSIS — I10 Essential (primary) hypertension: Secondary | ICD-10-CM | POA: Diagnosis not present

## 2014-10-20 DIAGNOSIS — J301 Allergic rhinitis due to pollen: Secondary | ICD-10-CM | POA: Diagnosis not present

## 2014-10-20 DIAGNOSIS — J309 Allergic rhinitis, unspecified: Secondary | ICD-10-CM | POA: Diagnosis not present

## 2014-10-20 DIAGNOSIS — E785 Hyperlipidemia, unspecified: Secondary | ICD-10-CM | POA: Diagnosis not present

## 2014-10-20 DIAGNOSIS — E559 Vitamin D deficiency, unspecified: Secondary | ICD-10-CM | POA: Diagnosis not present

## 2014-10-24 DIAGNOSIS — M654 Radial styloid tenosynovitis [de Quervain]: Secondary | ICD-10-CM | POA: Diagnosis not present

## 2014-10-27 DIAGNOSIS — E559 Vitamin D deficiency, unspecified: Secondary | ICD-10-CM | POA: Diagnosis not present

## 2014-10-27 DIAGNOSIS — L298 Other pruritus: Secondary | ICD-10-CM | POA: Diagnosis not present

## 2014-10-27 DIAGNOSIS — R2681 Unsteadiness on feet: Secondary | ICD-10-CM | POA: Diagnosis not present

## 2014-10-27 DIAGNOSIS — J309 Allergic rhinitis, unspecified: Secondary | ICD-10-CM | POA: Diagnosis not present

## 2014-10-27 DIAGNOSIS — L309 Dermatitis, unspecified: Secondary | ICD-10-CM | POA: Diagnosis not present

## 2014-10-27 DIAGNOSIS — I10 Essential (primary) hypertension: Secondary | ICD-10-CM | POA: Diagnosis not present

## 2014-10-27 DIAGNOSIS — I119 Hypertensive heart disease without heart failure: Secondary | ICD-10-CM | POA: Diagnosis not present

## 2014-10-27 DIAGNOSIS — E785 Hyperlipidemia, unspecified: Secondary | ICD-10-CM | POA: Diagnosis not present

## 2014-12-05 DIAGNOSIS — M654 Radial styloid tenosynovitis [de Quervain]: Secondary | ICD-10-CM | POA: Diagnosis not present

## 2014-12-09 DIAGNOSIS — Z113 Encounter for screening for infections with a predominantly sexual mode of transmission: Secondary | ICD-10-CM | POA: Diagnosis not present

## 2014-12-09 DIAGNOSIS — Z1389 Encounter for screening for other disorder: Secondary | ICD-10-CM | POA: Diagnosis not present

## 2014-12-09 DIAGNOSIS — Z0100 Encounter for examination of eyes and vision without abnormal findings: Secondary | ICD-10-CM | POA: Diagnosis not present

## 2014-12-09 DIAGNOSIS — Z136 Encounter for screening for cardiovascular disorders: Secondary | ICD-10-CM | POA: Diagnosis not present

## 2014-12-09 DIAGNOSIS — E785 Hyperlipidemia, unspecified: Secondary | ICD-10-CM | POA: Diagnosis not present

## 2014-12-09 DIAGNOSIS — Z01118 Encounter for examination of ears and hearing with other abnormal findings: Secondary | ICD-10-CM | POA: Diagnosis not present

## 2014-12-09 DIAGNOSIS — I119 Hypertensive heart disease without heart failure: Secondary | ICD-10-CM | POA: Diagnosis not present

## 2014-12-09 DIAGNOSIS — Z131 Encounter for screening for diabetes mellitus: Secondary | ICD-10-CM | POA: Diagnosis not present

## 2014-12-09 DIAGNOSIS — Z0000 Encounter for general adult medical examination without abnormal findings: Secondary | ICD-10-CM | POA: Diagnosis not present

## 2014-12-09 DIAGNOSIS — E559 Vitamin D deficiency, unspecified: Secondary | ICD-10-CM | POA: Diagnosis not present

## 2014-12-09 DIAGNOSIS — J309 Allergic rhinitis, unspecified: Secondary | ICD-10-CM | POA: Diagnosis not present

## 2014-12-09 DIAGNOSIS — I10 Essential (primary) hypertension: Secondary | ICD-10-CM | POA: Diagnosis not present

## 2014-12-11 ENCOUNTER — Other Ambulatory Visit: Payer: Self-pay

## 2014-12-11 DIAGNOSIS — Z1231 Encounter for screening mammogram for malignant neoplasm of breast: Secondary | ICD-10-CM

## 2015-01-01 DIAGNOSIS — M654 Radial styloid tenosynovitis [de Quervain]: Secondary | ICD-10-CM | POA: Diagnosis not present

## 2015-01-02 ENCOUNTER — Ambulatory Visit
Admission: RE | Admit: 2015-01-02 | Discharge: 2015-01-02 | Disposition: A | Discharge status: Home / Self Care | Payer: Medicare Other | Source: Ambulatory Visit

## 2015-01-02 DIAGNOSIS — Z1231 Encounter for screening mammogram for malignant neoplasm of breast: Secondary | ICD-10-CM | POA: Diagnosis not present

## 2015-01-08 DIAGNOSIS — L309 Dermatitis, unspecified: Secondary | ICD-10-CM | POA: Diagnosis not present

## 2015-01-19 DIAGNOSIS — E559 Vitamin D deficiency, unspecified: Secondary | ICD-10-CM | POA: Diagnosis not present

## 2015-01-19 DIAGNOSIS — Z124 Encounter for screening for malignant neoplasm of cervix: Secondary | ICD-10-CM | POA: Diagnosis not present

## 2015-01-19 DIAGNOSIS — I10 Essential (primary) hypertension: Secondary | ICD-10-CM | POA: Diagnosis not present

## 2015-01-19 DIAGNOSIS — L298 Other pruritus: Secondary | ICD-10-CM | POA: Diagnosis not present

## 2015-01-19 DIAGNOSIS — L309 Dermatitis, unspecified: Secondary | ICD-10-CM | POA: Diagnosis not present

## 2015-01-19 DIAGNOSIS — J309 Allergic rhinitis, unspecified: Secondary | ICD-10-CM | POA: Diagnosis not present

## 2015-01-19 DIAGNOSIS — I119 Hypertensive heart disease without heart failure: Secondary | ICD-10-CM | POA: Diagnosis not present

## 2015-01-19 DIAGNOSIS — Z23 Encounter for immunization: Secondary | ICD-10-CM | POA: Diagnosis not present

## 2015-01-19 DIAGNOSIS — R2681 Unsteadiness on feet: Secondary | ICD-10-CM | POA: Diagnosis not present

## 2015-01-19 DIAGNOSIS — E785 Hyperlipidemia, unspecified: Secondary | ICD-10-CM | POA: Diagnosis not present

## 2015-01-19 DIAGNOSIS — N760 Acute vaginitis: Secondary | ICD-10-CM | POA: Diagnosis not present

## 2015-01-28 DIAGNOSIS — R2232 Localized swelling, mass and lump, left upper limb: Secondary | ICD-10-CM | POA: Diagnosis not present

## 2015-01-28 DIAGNOSIS — M659 Synovitis and tenosynovitis, unspecified: Secondary | ICD-10-CM | POA: Diagnosis not present

## 2015-01-28 DIAGNOSIS — M65842 Other synovitis and tenosynovitis, left hand: Secondary | ICD-10-CM | POA: Diagnosis not present

## 2015-02-10 DIAGNOSIS — Z4789 Encounter for other orthopedic aftercare: Secondary | ICD-10-CM | POA: Diagnosis not present

## 2015-03-10 DIAGNOSIS — M654 Radial styloid tenosynovitis [de Quervain]: Secondary | ICD-10-CM | POA: Diagnosis not present

## 2015-03-10 DIAGNOSIS — Z4789 Encounter for other orthopedic aftercare: Secondary | ICD-10-CM | POA: Diagnosis not present

## 2015-05-04 DIAGNOSIS — E785 Hyperlipidemia, unspecified: Secondary | ICD-10-CM | POA: Diagnosis not present

## 2015-05-04 DIAGNOSIS — E559 Vitamin D deficiency, unspecified: Secondary | ICD-10-CM | POA: Diagnosis not present

## 2015-05-04 DIAGNOSIS — I119 Hypertensive heart disease without heart failure: Secondary | ICD-10-CM | POA: Diagnosis not present

## 2015-05-04 DIAGNOSIS — J309 Allergic rhinitis, unspecified: Secondary | ICD-10-CM | POA: Diagnosis not present

## 2015-05-04 DIAGNOSIS — R2681 Unsteadiness on feet: Secondary | ICD-10-CM | POA: Diagnosis not present

## 2015-05-04 DIAGNOSIS — I10 Essential (primary) hypertension: Secondary | ICD-10-CM | POA: Diagnosis not present

## 2015-05-04 DIAGNOSIS — L309 Dermatitis, unspecified: Secondary | ICD-10-CM | POA: Diagnosis not present

## 2015-05-04 DIAGNOSIS — L0390 Cellulitis, unspecified: Secondary | ICD-10-CM | POA: Diagnosis not present

## 2015-05-19 ENCOUNTER — Encounter (HOSPITAL_COMMUNITY): Payer: Self-pay | Admitting: Cardiology

## 2015-05-19 ENCOUNTER — Emergency Department (HOSPITAL_COMMUNITY)
Admission: EM | Admit: 2015-05-19 | Discharge: 2015-05-19 | Disposition: A | Discharge status: Home / Self Care | Payer: Medicare Other | Attending: Emergency Medicine | Admitting: Emergency Medicine

## 2015-05-19 DIAGNOSIS — R630 Anorexia: Secondary | ICD-10-CM | POA: Insufficient documentation

## 2015-05-19 DIAGNOSIS — R112 Nausea with vomiting, unspecified: Secondary | ICD-10-CM | POA: Diagnosis not present

## 2015-05-19 DIAGNOSIS — F17200 Nicotine dependence, unspecified, uncomplicated: Secondary | ICD-10-CM | POA: Diagnosis not present

## 2015-05-19 DIAGNOSIS — Z79899 Other long term (current) drug therapy: Secondary | ICD-10-CM | POA: Insufficient documentation

## 2015-05-19 DIAGNOSIS — I10 Essential (primary) hypertension: Secondary | ICD-10-CM | POA: Insufficient documentation

## 2015-05-19 DIAGNOSIS — Z8612 Personal history of poliomyelitis: Secondary | ICD-10-CM | POA: Insufficient documentation

## 2015-05-19 DIAGNOSIS — R51 Headache: Secondary | ICD-10-CM | POA: Insufficient documentation

## 2015-05-19 DIAGNOSIS — R109 Unspecified abdominal pain: Secondary | ICD-10-CM | POA: Insufficient documentation

## 2015-05-19 DIAGNOSIS — Z7951 Long term (current) use of inhaled steroids: Secondary | ICD-10-CM | POA: Insufficient documentation

## 2015-05-19 LAB — CBC
HCT: 42.6 % (ref 36.0–46.0)
Hemoglobin: 15.1 g/dL — ABNORMAL HIGH (ref 12.0–15.0)
MCH: 31.2 pg (ref 26.0–34.0)
MCHC: 35.4 g/dL (ref 30.0–36.0)
MCV: 88 fL (ref 78.0–100.0)
PLATELETS: 352 10*3/uL (ref 150–400)
RBC: 4.84 MIL/uL (ref 3.87–5.11)
RDW: 11.9 % (ref 11.5–15.5)
WBC: 4.5 10*3/uL (ref 4.0–10.5)

## 2015-05-19 LAB — URINALYSIS, ROUTINE W REFLEX MICROSCOPIC
Bilirubin Urine: NEGATIVE
GLUCOSE, UA: NEGATIVE mg/dL
KETONES UR: NEGATIVE mg/dL
NITRITE: NEGATIVE
PROTEIN: NEGATIVE mg/dL
Specific Gravity, Urine: 1.025 (ref 1.005–1.030)
pH: 6.5 (ref 5.0–8.0)

## 2015-05-19 LAB — LIPASE, BLOOD: Lipase: 23 U/L (ref 11–51)

## 2015-05-19 LAB — COMPREHENSIVE METABOLIC PANEL
ALT: 15 U/L (ref 14–54)
AST: 23 U/L (ref 15–41)
Albumin: 4.6 g/dL (ref 3.5–5.0)
Alkaline Phosphatase: 76 U/L (ref 38–126)
Anion gap: 15 (ref 5–15)
BILIRUBIN TOTAL: 1.6 mg/dL — AB (ref 0.3–1.2)
BUN: 17 mg/dL (ref 6–20)
CALCIUM: 9.9 mg/dL (ref 8.9–10.3)
CO2: 24 mmol/L (ref 22–32)
CREATININE: 0.6 mg/dL (ref 0.44–1.00)
Chloride: 100 mmol/L — ABNORMAL LOW (ref 101–111)
GFR calc Af Amer: >60 mL/min (ref >60)
Glucose, Bld: 149 mg/dL — ABNORMAL HIGH (ref 65–99)
Potassium: 3.6 mmol/L (ref 3.5–5.1)
Sodium: 139 mmol/L (ref 135–145)
TOTAL PROTEIN: 8 g/dL (ref 6.5–8.1)

## 2015-05-19 LAB — URINE MICROSCOPIC-ADD ON

## 2015-05-19 MED ORDER — ONDANSETRON HCL 4 MG PO TABS: 4.0000 mg | ORAL_TABLET | Freq: Four times a day (QID) | ORAL | Status: AC

## 2015-05-19 MED ORDER — SODIUM CHLORIDE 0.9 % IV BOLUS (SEPSIS)
1000.0000 mL | Freq: Once | INTRAVENOUS | Status: AC
  Administered 2015-05-19: 1000 mL via INTRAVENOUS

## 2015-05-19 MED ORDER — ONDANSETRON HCL 4 MG/2ML IJ SOLN
4.0000 mg | Freq: Once | INTRAMUSCULAR | Status: AC
  Administered 2015-05-19: 4 mg via INTRAVENOUS
  Filled 2015-05-19: qty 2

## 2015-05-19 NOTE — Discharge Instructions (Signed)
Return to the ED with any concerns including vomiting and not able to keep down liquids or your medications, abdominal pain especially if it localizes to the right lower abdomen, fever or chills, and decreased urine output, decreased level of alertness or lethargy, or any other alarming symptoms.  °

## 2015-05-19 NOTE — ED Notes (Signed)
Pt reports generalized abd pain and vomiting for the past 3 weeks. Also been having a headache, and no appetite.

## 2015-05-19 NOTE — ED Provider Notes (Signed)
CSN: [id number]     Arrival date & time 05/19/15  4098 History   First MD Initiated Contact with Patient 05/19/15 1111     Chief Complaint  Patient presents with  . Abdominal Pain  . Emesis  . Headache     (Consider location/radiation/quality/duration/timing/severity/associated sxs/prior Treatment) HPI  Pt presenting with c/o abdominal pain and vomiting over the past 3 weeks.  She states that she has not vomited today. She currently has no abdominal pain.  Symptoms began 3 weeks ago, primarily now she is c/o hot flashes and poor appetite. No fever.  No chest pain or difficulty breathing.  No diarrhea.  Emesis is nonbloody and nonbilious.  She has been keeping down some liquids but has had a poor appetite for solid foods.  She has had no treatment prior to arrival.  There are no other associated systemic symptoms, there are no other alleviating or modifying factors.   Past Medical History  Diagnosis Date  . Hypertension   . Polio    Past Surgical History  Procedure Laterality Date  . Leg surgery    . Wrist surgery     History reviewed. No pertinent family history. Social History  Substance Use Topics  . Smoking status: Current Some Day Smoker  . Smokeless tobacco: None  . Alcohol Use: No   OB History    No data available     Review of Systems  ROS reviewed and all otherwise negative except for mentioned in HPI    Allergies  Review of patient's allergies indicates no known allergies.  Home Medications   Prior to Admission medications   Medication Sig Start Date End Date Taking? Authorizing Provider  albuterol (PROVENTIL HFA;VENTOLIN HFA) 108 (90 BASE) MCG/ACT inhaler Inhale 1-2 puffs into the lungs every 6 (six) hours as needed for wheezing. 06/07/12  Yes Richardean Canal, MD  cholecalciferol (VITAMIN D) 1000 UNITS tablet Take 1,000 Units by mouth daily.   Yes Historical Provider, MD  diclofenac (VOLTAREN) 75 MG EC tablet Take 75 mg by mouth 2 (two) times daily as needed for  mild pain.   Yes Historical Provider, MD  diphenhydrAMINE (SOMINEX) 25 MG tablet Take 25 mg by mouth at bedtime as needed for itching or sleep.   Yes Historical Provider, MD  fluticasone (FLONASE) 50 MCG/ACT nasal spray Place 1 spray into both nostrils daily as needed for allergies or rhinitis.   Yes Historical Provider, MD  HYDROcodone-acetaminophen (NORCO/VICODIN) 5-325 MG tablet Take 1 tablet by mouth 2 (two) times daily.   Yes Historical Provider, MD  lisinopril (PRINIVIL,ZESTRIL) 20 MG tablet Take 20 mg by mouth daily.   Yes Historical Provider, MD  montelukast (SINGULAIR) 10 MG tablet Take 10 mg by mouth daily.   Yes Historical Provider, MD  pravastatin (PRAVACHOL) 40 MG tablet Take 40 mg by mouth daily.   Yes Historical Provider, MD  metoCLOPramide (REGLAN) 10 MG tablet Take 1 tablet (10 mg total) by mouth every 6 (six) hours as needed (nausea/headache). Patient not taking: Reported on 05/19/2015 06/07/12   Richardean Canal, MD  ondansetron (ZOFRAN) 4 MG tablet Take 1 tablet (4 mg total) by mouth every 6 (six) hours. 05/19/15   Jerelyn Scott, MD   BP 118/85 mmHg  Pulse 97  Temp(Src) 98.1 F (36.7 C) (Oral)  Resp 20  Ht  (1.575 m)  Wt 72.576 kg  BMI 29.26 kg/m2  SpO2 99%  LMP 11/21/2012  Vitals reviewed Physical Exam  Physical Examination: General appearance -  alert, well appearing, and in no distress Mental status - alert, oriented to person, place, and time Eyes -  No conjunctival injection ,no scleral icteruis Mouth - mucous membranes moist, pharynx normal without lesions Chest - clear to auscultation, no wheezes, rales or rhonchi, symmetric air entry Heart - normal rate, regular rhythm, normal S1, S2, no murmurs, rubs, clicks or gallops Abdomen - soft, nontender, nondistended, no masses or organomegaly, nabs Neurological - alert, oriented, normal speech Extremities - peripheral pulses normal, no pedal edema, no clubbing or cyanosis Skin - normal coloration and turgor, no  rashes  ED Course  Procedures (including critical care time) Labs Review Labs Reviewed  COMPREHENSIVE METABOLIC PANEL - Abnormal; Notable for the following:    Chloride 100 (*)    Glucose, Bld 149 (*)    Total Bilirubin 1.6 (*)    All other components within normal limits  CBC - Abnormal; Notable for the following:    Hemoglobin 15.1 (*)    All other components within normal limits  URINALYSIS, ROUTINE W REFLEX MICROSCOPIC (NOT AT [facility]) - Abnormal; Notable for the following:    Color, Urine AMBER (*)    APPearance CLOUDY (*)    Hgb urine dipstick SMALL (*)    Leukocytes, UA MODERATE (*)    All other components within normal limits  URINE MICROSCOPIC-ADD ON - Abnormal; Notable for the following:    Squamous Epithelial / LPF 6-30 (*)    Bacteria, UA FEW (*)    All other components within normal limits  URINE CULTURE  LIPASE, BLOOD    Imaging Review No results found. I have personally reviewed and evaluated these images and lab results as part of my medical decision-making.   EKG Interpretation None      MDM   Final diagnoses:  Nausea and vomiting, vomiting of unspecified type    Pt presenting with c/o abdominal pain and vomiting for the past 3 weeks. Today she has no abdominal pain, abdomen is nontender.  She is not vomiting today.  Labs are reassuring.  Pt may have had a viral illness that is resolving now.  Pt given rx for zofran.  Discharged with strict return precautions.  Pt agreeable with plan.    Jerelyn Scott, MD 05/22/15 [phone]

## 2015-05-20 LAB — URINE CULTURE: CULTURE: NO GROWTH

## 2015-05-25 DIAGNOSIS — R2681 Unsteadiness on feet: Secondary | ICD-10-CM | POA: Diagnosis not present

## 2015-05-25 DIAGNOSIS — G43909 Migraine, unspecified, not intractable, without status migrainosus: Secondary | ICD-10-CM | POA: Diagnosis not present

## 2015-05-25 DIAGNOSIS — I119 Hypertensive heart disease without heart failure: Secondary | ICD-10-CM | POA: Diagnosis not present

## 2015-05-25 DIAGNOSIS — E559 Vitamin D deficiency, unspecified: Secondary | ICD-10-CM | POA: Diagnosis not present

## 2015-05-25 DIAGNOSIS — I10 Essential (primary) hypertension: Secondary | ICD-10-CM | POA: Diagnosis not present

## 2015-05-25 DIAGNOSIS — E785 Hyperlipidemia, unspecified: Secondary | ICD-10-CM | POA: Diagnosis not present

## 2015-05-25 DIAGNOSIS — L298 Other pruritus: Secondary | ICD-10-CM | POA: Diagnosis not present

## 2015-05-25 DIAGNOSIS — E119 Type 2 diabetes mellitus without complications: Secondary | ICD-10-CM | POA: Diagnosis not present

## 2015-05-25 DIAGNOSIS — J309 Allergic rhinitis, unspecified: Secondary | ICD-10-CM | POA: Diagnosis not present

## 2015-05-25 DIAGNOSIS — K5900 Constipation, unspecified: Secondary | ICD-10-CM | POA: Diagnosis not present

## 2015-06-08 DIAGNOSIS — Z4789 Encounter for other orthopedic aftercare: Secondary | ICD-10-CM | POA: Diagnosis not present

## 2015-06-08 DIAGNOSIS — M654 Radial styloid tenosynovitis [de Quervain]: Secondary | ICD-10-CM | POA: Diagnosis not present

## 2015-07-06 DIAGNOSIS — M654 Radial styloid tenosynovitis [de Quervain]: Secondary | ICD-10-CM | POA: Diagnosis not present

## 2015-07-16 DIAGNOSIS — R2681 Unsteadiness on feet: Secondary | ICD-10-CM | POA: Diagnosis not present

## 2015-07-16 DIAGNOSIS — E559 Vitamin D deficiency, unspecified: Secondary | ICD-10-CM | POA: Diagnosis not present

## 2015-07-16 DIAGNOSIS — E785 Hyperlipidemia, unspecified: Secondary | ICD-10-CM | POA: Diagnosis not present

## 2015-07-16 DIAGNOSIS — I119 Hypertensive heart disease without heart failure: Secondary | ICD-10-CM | POA: Diagnosis not present

## 2015-07-16 DIAGNOSIS — K5900 Constipation, unspecified: Secondary | ICD-10-CM | POA: Diagnosis not present

## 2015-07-16 DIAGNOSIS — I10 Essential (primary) hypertension: Secondary | ICD-10-CM | POA: Diagnosis not present

## 2015-07-16 DIAGNOSIS — J309 Allergic rhinitis, unspecified: Secondary | ICD-10-CM | POA: Diagnosis not present

## 2015-07-16 DIAGNOSIS — G43909 Migraine, unspecified, not intractable, without status migrainosus: Secondary | ICD-10-CM | POA: Diagnosis not present

## 2015-09-03 DIAGNOSIS — M654 Radial styloid tenosynovitis [de Quervain]: Secondary | ICD-10-CM | POA: Diagnosis not present

## 2015-09-10 DIAGNOSIS — J309 Allergic rhinitis, unspecified: Secondary | ICD-10-CM | POA: Diagnosis not present

## 2015-09-10 DIAGNOSIS — R2681 Unsteadiness on feet: Secondary | ICD-10-CM | POA: Diagnosis not present

## 2015-09-10 DIAGNOSIS — G43909 Migraine, unspecified, not intractable, without status migrainosus: Secondary | ICD-10-CM | POA: Diagnosis not present

## 2015-09-10 DIAGNOSIS — I119 Hypertensive heart disease without heart failure: Secondary | ICD-10-CM | POA: Diagnosis not present

## 2015-09-10 DIAGNOSIS — K5900 Constipation, unspecified: Secondary | ICD-10-CM | POA: Diagnosis not present

## 2015-09-10 DIAGNOSIS — E785 Hyperlipidemia, unspecified: Secondary | ICD-10-CM | POA: Diagnosis not present

## 2015-09-10 DIAGNOSIS — E559 Vitamin D deficiency, unspecified: Secondary | ICD-10-CM | POA: Diagnosis not present

## 2015-09-10 DIAGNOSIS — L309 Dermatitis, unspecified: Secondary | ICD-10-CM | POA: Diagnosis not present

## 2015-09-10 DIAGNOSIS — I10 Essential (primary) hypertension: Secondary | ICD-10-CM | POA: Diagnosis not present

## 2015-11-24 ENCOUNTER — Other Ambulatory Visit: Payer: Self-pay | Admitting: Internal Medicine

## 2015-11-24 DIAGNOSIS — Z1231 Encounter for screening mammogram for malignant neoplasm of breast: Secondary | ICD-10-CM

## 2015-12-03 DIAGNOSIS — R7303 Prediabetes: Secondary | ICD-10-CM | POA: Diagnosis not present

## 2015-12-03 DIAGNOSIS — I10 Essential (primary) hypertension: Secondary | ICD-10-CM | POA: Diagnosis not present

## 2015-12-03 DIAGNOSIS — I119 Hypertensive heart disease without heart failure: Secondary | ICD-10-CM | POA: Diagnosis not present

## 2015-12-04 DIAGNOSIS — M654 Radial styloid tenosynovitis [de Quervain]: Secondary | ICD-10-CM | POA: Diagnosis not present

## 2015-12-10 DIAGNOSIS — Z131 Encounter for screening for diabetes mellitus: Secondary | ICD-10-CM | POA: Diagnosis not present

## 2015-12-10 DIAGNOSIS — E785 Hyperlipidemia, unspecified: Secondary | ICD-10-CM | POA: Diagnosis not present

## 2015-12-10 DIAGNOSIS — I119 Hypertensive heart disease without heart failure: Secondary | ICD-10-CM | POA: Diagnosis not present

## 2015-12-10 DIAGNOSIS — G43909 Migraine, unspecified, not intractable, without status migrainosus: Secondary | ICD-10-CM | POA: Diagnosis not present

## 2015-12-10 DIAGNOSIS — I10 Essential (primary) hypertension: Secondary | ICD-10-CM | POA: Diagnosis not present

## 2015-12-10 DIAGNOSIS — Z0000 Encounter for general adult medical examination without abnormal findings: Secondary | ICD-10-CM | POA: Diagnosis not present

## 2015-12-10 DIAGNOSIS — E559 Vitamin D deficiency, unspecified: Secondary | ICD-10-CM | POA: Diagnosis not present

## 2015-12-10 DIAGNOSIS — J309 Allergic rhinitis, unspecified: Secondary | ICD-10-CM | POA: Diagnosis not present

## 2015-12-16 DIAGNOSIS — G43909 Migraine, unspecified, not intractable, without status migrainosus: Secondary | ICD-10-CM | POA: Diagnosis not present

## 2015-12-16 DIAGNOSIS — R2681 Unsteadiness on feet: Secondary | ICD-10-CM | POA: Diagnosis not present

## 2015-12-16 DIAGNOSIS — Z113 Encounter for screening for infections with a predominantly sexual mode of transmission: Secondary | ICD-10-CM | POA: Diagnosis not present

## 2015-12-16 DIAGNOSIS — N760 Acute vaginitis: Secondary | ICD-10-CM | POA: Diagnosis not present

## 2015-12-16 DIAGNOSIS — J309 Allergic rhinitis, unspecified: Secondary | ICD-10-CM | POA: Diagnosis not present

## 2015-12-16 DIAGNOSIS — I119 Hypertensive heart disease without heart failure: Secondary | ICD-10-CM | POA: Diagnosis not present

## 2015-12-16 DIAGNOSIS — Z124 Encounter for screening for malignant neoplasm of cervix: Secondary | ICD-10-CM | POA: Diagnosis not present

## 2015-12-16 DIAGNOSIS — E785 Hyperlipidemia, unspecified: Secondary | ICD-10-CM | POA: Diagnosis not present

## 2015-12-16 DIAGNOSIS — I10 Essential (primary) hypertension: Secondary | ICD-10-CM | POA: Diagnosis not present

## 2015-12-16 DIAGNOSIS — E559 Vitamin D deficiency, unspecified: Secondary | ICD-10-CM | POA: Diagnosis not present

## 2016-01-15 ENCOUNTER — Ambulatory Visit
Admission: RE | Admit: 2016-01-15 | Discharge: 2016-01-15 | Disposition: A | Discharge status: Home / Self Care | Payer: Medicare Other | Source: Ambulatory Visit | Attending: Internal Medicine | Admitting: Internal Medicine

## 2016-01-15 DIAGNOSIS — Z1231 Encounter for screening mammogram for malignant neoplasm of breast: Secondary | ICD-10-CM

## 2016-06-30 DIAGNOSIS — E559 Vitamin D deficiency, unspecified: Secondary | ICD-10-CM | POA: Diagnosis not present

## 2016-06-30 DIAGNOSIS — I119 Hypertensive heart disease without heart failure: Secondary | ICD-10-CM | POA: Diagnosis not present

## 2016-06-30 DIAGNOSIS — J309 Allergic rhinitis, unspecified: Secondary | ICD-10-CM | POA: Diagnosis not present

## 2016-06-30 DIAGNOSIS — I10 Essential (primary) hypertension: Secondary | ICD-10-CM | POA: Diagnosis not present

## 2016-06-30 DIAGNOSIS — E785 Hyperlipidemia, unspecified: Secondary | ICD-10-CM | POA: Diagnosis not present

## 2016-06-30 DIAGNOSIS — R2681 Unsteadiness on feet: Secondary | ICD-10-CM | POA: Diagnosis not present

## 2016-06-30 DIAGNOSIS — G43909 Migraine, unspecified, not intractable, without status migrainosus: Secondary | ICD-10-CM | POA: Diagnosis not present

## 2016-07-01 ENCOUNTER — Other Ambulatory Visit: Payer: Self-pay | Admitting: Physician Assistant

## 2016-07-01 DIAGNOSIS — Z1231 Encounter for screening mammogram for malignant neoplasm of breast: Secondary | ICD-10-CM

## 2016-07-04 ENCOUNTER — Ambulatory Visit: Payer: Medicare Other

## 2016-07-07 ENCOUNTER — Ambulatory Visit
Admission: RE | Admit: 2016-07-07 | Discharge: 2016-07-07 | Disposition: A | Discharge status: Home / Self Care | Payer: Medicare Other | Source: Ambulatory Visit | Attending: Physician Assistant | Admitting: Physician Assistant

## 2016-07-07 DIAGNOSIS — Z1231 Encounter for screening mammogram for malignant neoplasm of breast: Secondary | ICD-10-CM

## 2016-07-26 DIAGNOSIS — M654 Radial styloid tenosynovitis [de Quervain]: Secondary | ICD-10-CM | POA: Diagnosis not present

## 2016-08-08 DIAGNOSIS — M654 Radial styloid tenosynovitis [de Quervain]: Secondary | ICD-10-CM | POA: Diagnosis not present

## 2016-08-12 DIAGNOSIS — M654 Radial styloid tenosynovitis [de Quervain]: Secondary | ICD-10-CM | POA: Diagnosis not present

## 2016-09-27 IMAGING — US US TRANSVAGINAL NON-OB
1 series · 13 of 25 positions shown · non-contrast
Comparison: None

CLINICAL DATA: Menorrhagia, irregular menstrual cycles without pain
; history of polio which renders assuming supine positioning in
stirrups impossible.

EXAM:
TRANSABDOMINAL AND TRANSVAGINAL ULTRASOUND OF PELVIS
TECHNIQUE: Both transabdominal and transvaginal ultrasound examinations of the
pelvis were performed. Transabdominal technique was performed for
global imaging of the pelvis including uterus, ovaries, adnexal
regions, and pelvic cul-de-sac. It was necessary to proceed with
endovaginal exam following the transabdominal exam to visualize the
endometrium and adnexal structures.

[Series 1: us transvaginal non-ob · 0.26mm/px · 13 of 28 slices shown]
[im 1/28]
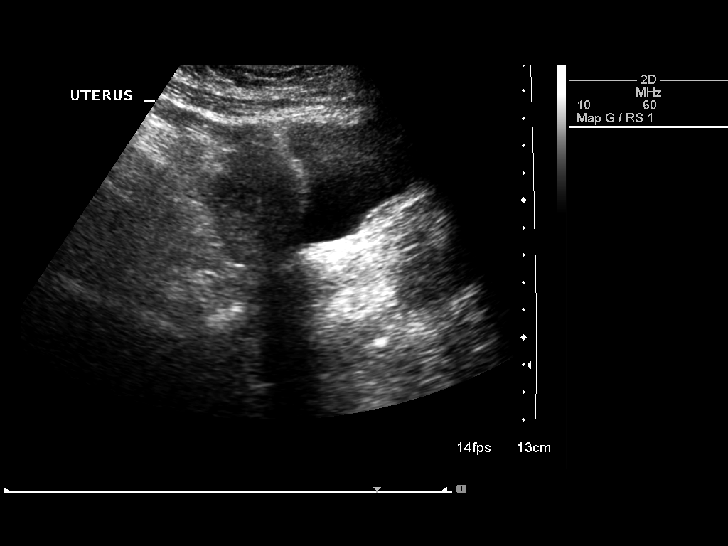
[im 3/28]
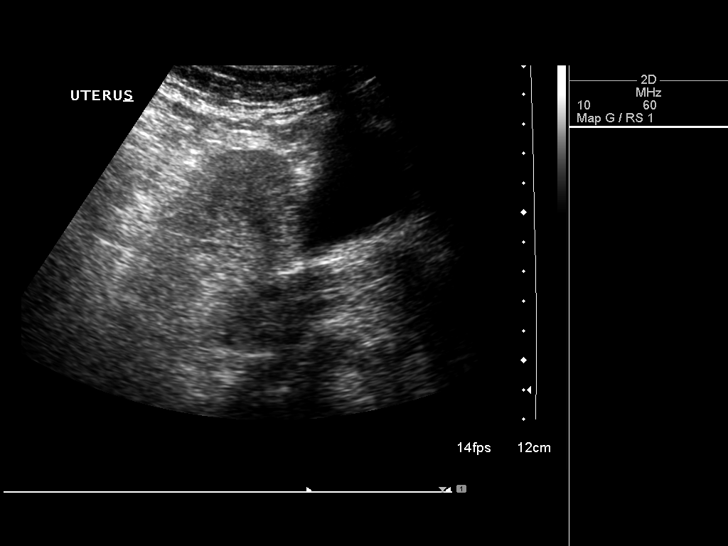
[im 5/28]
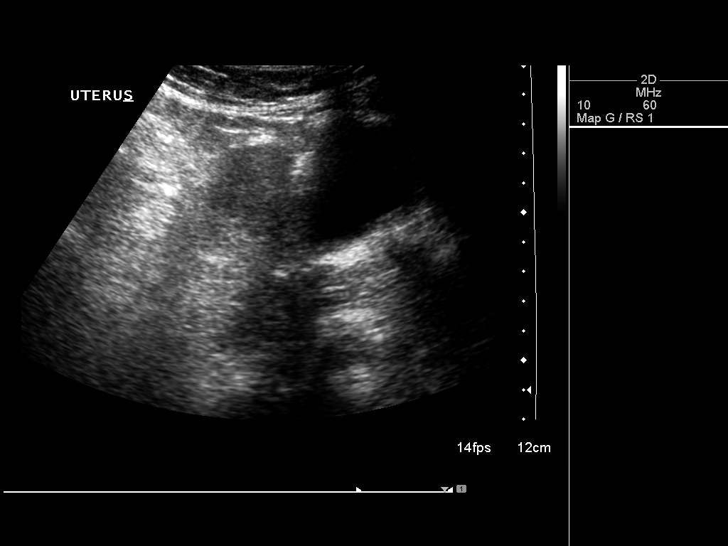
[im 7/28]
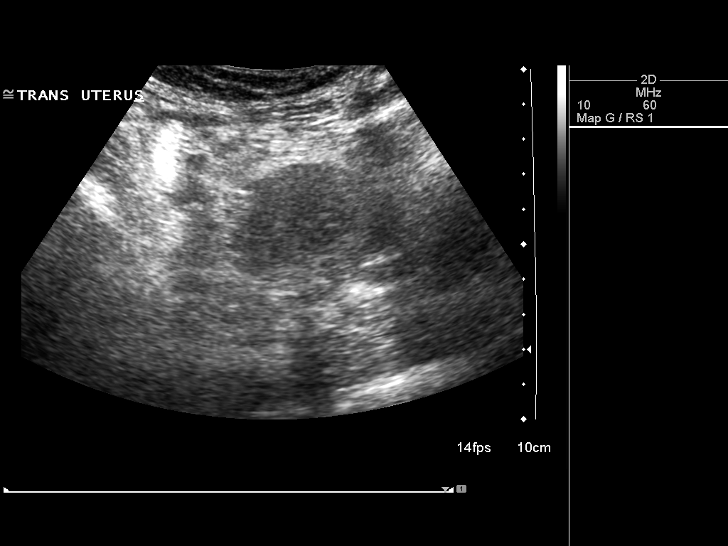
[im 10/28]
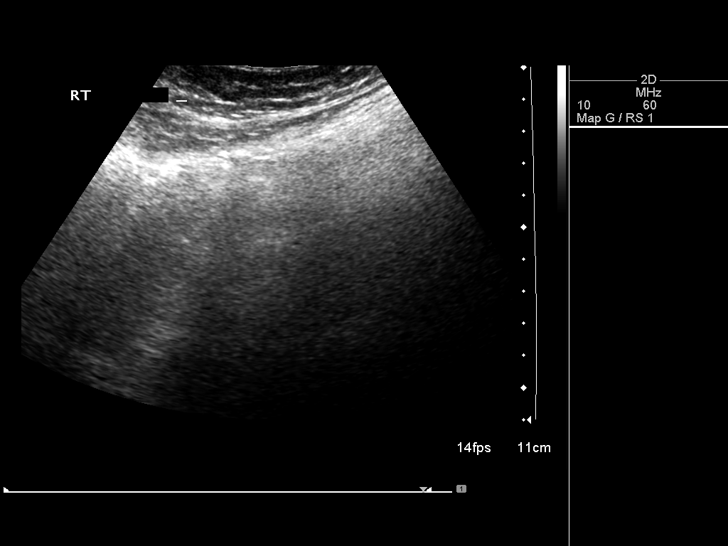
[im 12/28]
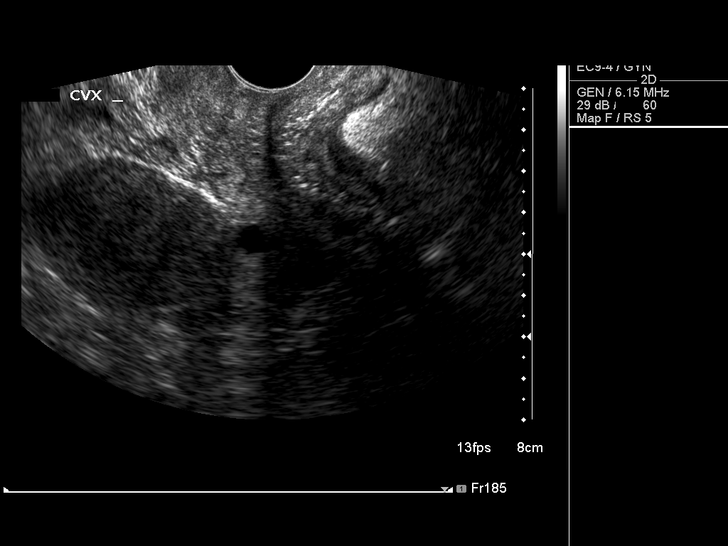
[im 14/28]
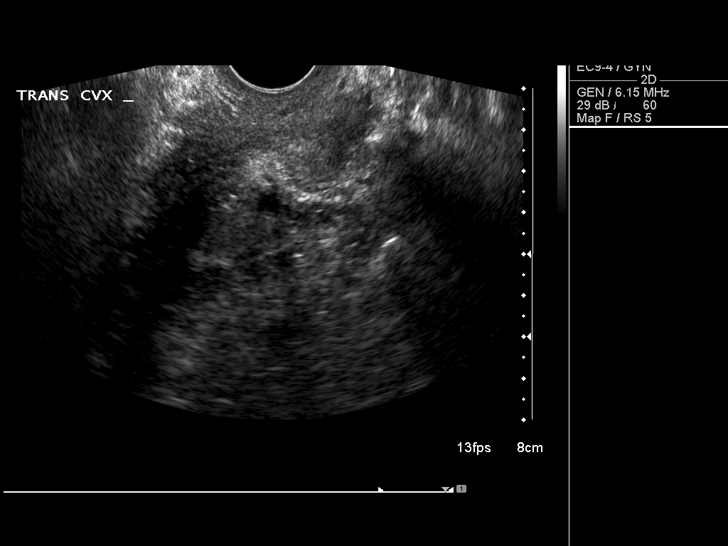
[im 16/28]
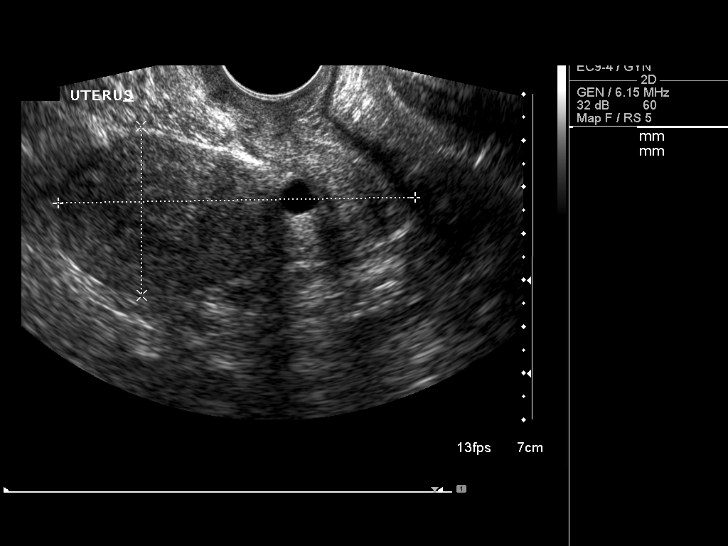
[im 19/28]
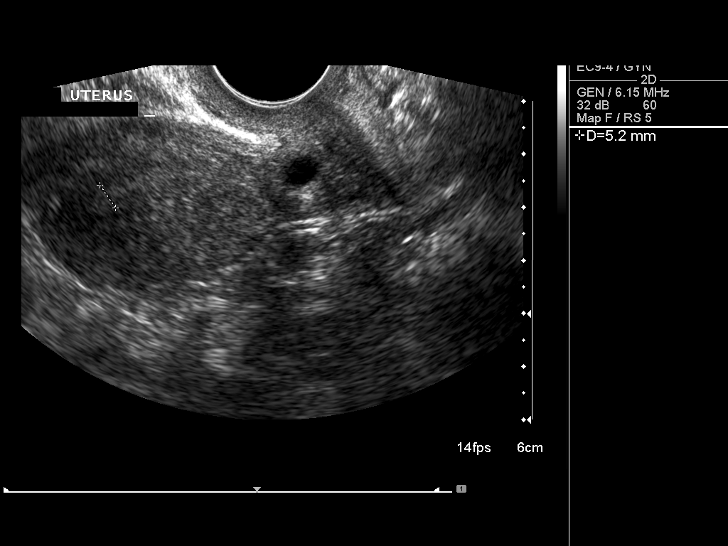
[im 21/28]
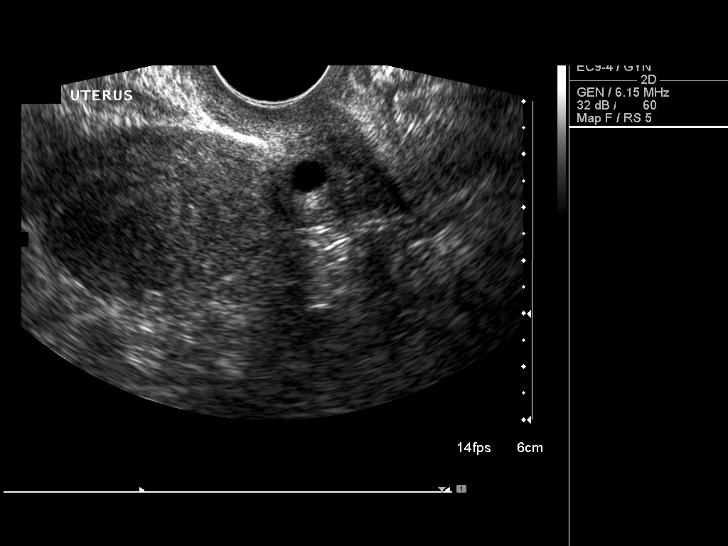
[im 23/28]
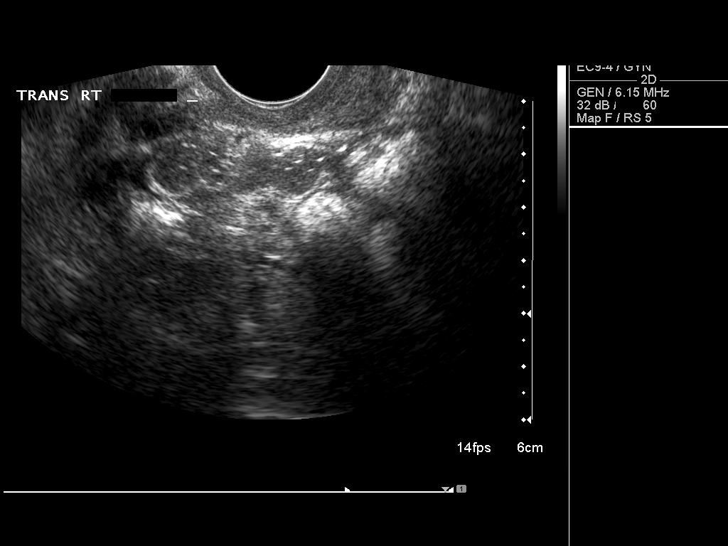
[im 25/28]
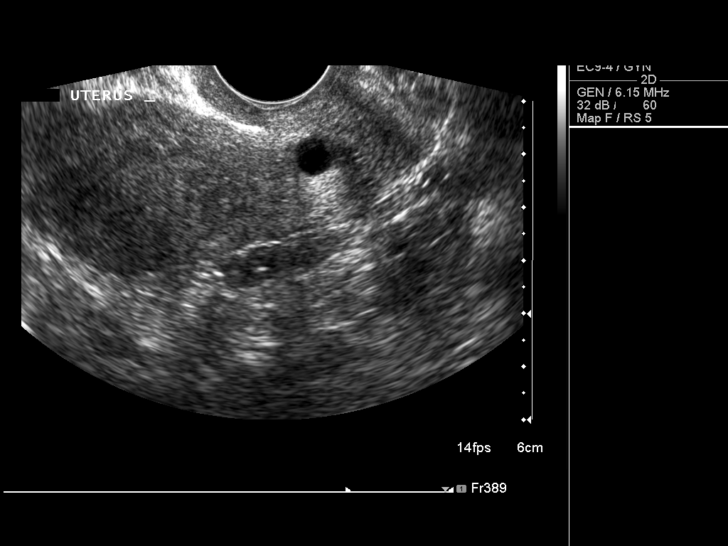
[im 28/28]
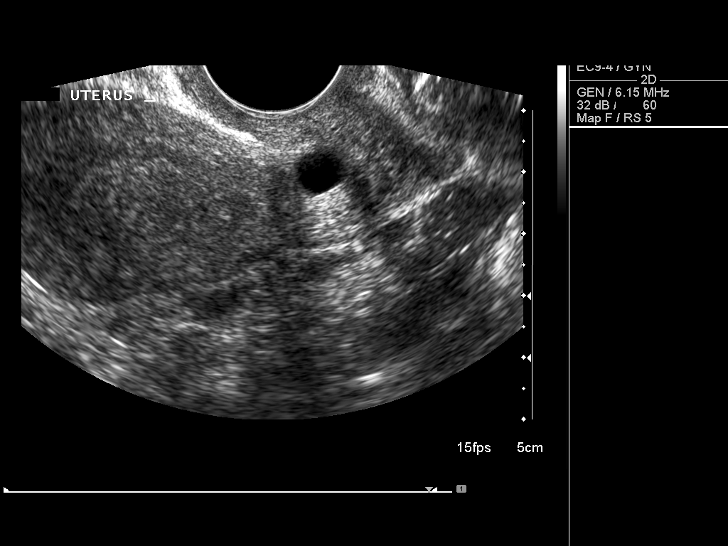

[13 of 25 positions shown; findings below may reference images not displayed]

FINDINGS: Uterus

Measurements: 7.2 x 3.8 x 4.4 cm. The myometrial echotexture is
within the limits of normal. Nabothian cysts are demonstrated.

Endometrium

Thickness: 5.2 mm.. No abnormal endometrial fluid collections are
demonstrated.

Right ovary

The right ovary could not be demonstrated. No adnexal masses were
observed.

Left ovary

The left ovary could not be demonstrated. No adnexal masses were
observed.

Other findings

No free fluid.
IMPRESSION: 1. The uterus is normal in size, echotexture, and contour. The
endometrial stripe is not abnormally thickened. If bleeding remains
unresponsive to hormonal or medical therapy, sonohysterogram should
be considered for focal lesion work-up. (Ref: Radiological
Reasoning: Algorithmic Workup of Abnormal Vaginal Bleeding with
Endovaginal Sonography and Sonohysterography. AJR [id number]; 191:S68-73)
2. Evaluation of the adnexal structure was limited due to the
patient's limitation with positioning secondary to polio. No normal
or abnormal ovarian/adnexal structures were demonstrated.

## 2016-09-30 DIAGNOSIS — E559 Vitamin D deficiency, unspecified: Secondary | ICD-10-CM | POA: Diagnosis not present

## 2016-09-30 DIAGNOSIS — Z5181 Encounter for therapeutic drug level monitoring: Secondary | ICD-10-CM | POA: Diagnosis not present

## 2016-09-30 DIAGNOSIS — R2681 Unsteadiness on feet: Secondary | ICD-10-CM | POA: Diagnosis not present

## 2016-09-30 DIAGNOSIS — G43909 Migraine, unspecified, not intractable, without status migrainosus: Secondary | ICD-10-CM | POA: Diagnosis not present

## 2016-09-30 DIAGNOSIS — E785 Hyperlipidemia, unspecified: Secondary | ICD-10-CM | POA: Diagnosis not present

## 2016-09-30 DIAGNOSIS — I119 Hypertensive heart disease without heart failure: Secondary | ICD-10-CM | POA: Diagnosis not present

## 2016-09-30 DIAGNOSIS — L299 Pruritus, unspecified: Secondary | ICD-10-CM | POA: Diagnosis not present

## 2016-09-30 DIAGNOSIS — J309 Allergic rhinitis, unspecified: Secondary | ICD-10-CM | POA: Diagnosis not present

## 2016-09-30 DIAGNOSIS — M25532 Pain in left wrist: Secondary | ICD-10-CM | POA: Diagnosis not present

## 2016-09-30 DIAGNOSIS — I10 Essential (primary) hypertension: Secondary | ICD-10-CM | POA: Diagnosis not present

## 2016-10-19 DIAGNOSIS — J309 Allergic rhinitis, unspecified: Secondary | ICD-10-CM | POA: Diagnosis not present

## 2016-10-19 DIAGNOSIS — R2681 Unsteadiness on feet: Secondary | ICD-10-CM | POA: Diagnosis not present

## 2016-10-19 DIAGNOSIS — E559 Vitamin D deficiency, unspecified: Secondary | ICD-10-CM | POA: Diagnosis not present

## 2016-10-19 DIAGNOSIS — Z5181 Encounter for therapeutic drug level monitoring: Secondary | ICD-10-CM | POA: Diagnosis not present

## 2016-10-19 DIAGNOSIS — G43909 Migraine, unspecified, not intractable, without status migrainosus: Secondary | ICD-10-CM | POA: Diagnosis not present

## 2016-10-19 DIAGNOSIS — L299 Pruritus, unspecified: Secondary | ICD-10-CM | POA: Diagnosis not present

## 2016-10-19 DIAGNOSIS — I119 Hypertensive heart disease without heart failure: Secondary | ICD-10-CM | POA: Diagnosis not present

## 2016-10-19 DIAGNOSIS — I10 Essential (primary) hypertension: Secondary | ICD-10-CM | POA: Diagnosis not present

## 2016-10-19 DIAGNOSIS — E785 Hyperlipidemia, unspecified: Secondary | ICD-10-CM | POA: Diagnosis not present

## 2016-11-23 DIAGNOSIS — I10 Essential (primary) hypertension: Secondary | ICD-10-CM | POA: Diagnosis not present

## 2016-11-23 DIAGNOSIS — Z113 Encounter for screening for infections with a predominantly sexual mode of transmission: Secondary | ICD-10-CM | POA: Diagnosis not present

## 2016-11-23 DIAGNOSIS — E785 Hyperlipidemia, unspecified: Secondary | ICD-10-CM | POA: Diagnosis not present

## 2016-11-23 DIAGNOSIS — G43909 Migraine, unspecified, not intractable, without status migrainosus: Secondary | ICD-10-CM | POA: Diagnosis not present

## 2016-11-23 DIAGNOSIS — Z124 Encounter for screening for malignant neoplasm of cervix: Secondary | ICD-10-CM | POA: Diagnosis not present

## 2016-11-23 DIAGNOSIS — J309 Allergic rhinitis, unspecified: Secondary | ICD-10-CM | POA: Diagnosis not present

## 2016-11-23 DIAGNOSIS — R2681 Unsteadiness on feet: Secondary | ICD-10-CM | POA: Diagnosis not present

## 2016-11-23 DIAGNOSIS — L299 Pruritus, unspecified: Secondary | ICD-10-CM | POA: Diagnosis not present

## 2016-11-23 DIAGNOSIS — E559 Vitamin D deficiency, unspecified: Secondary | ICD-10-CM | POA: Diagnosis not present

## 2016-11-23 DIAGNOSIS — I119 Hypertensive heart disease without heart failure: Secondary | ICD-10-CM | POA: Diagnosis not present

## 2016-11-25 ENCOUNTER — Other Ambulatory Visit: Payer: Self-pay | Admitting: Physician Assistant

## 2016-11-25 DIAGNOSIS — Z1231 Encounter for screening mammogram for malignant neoplasm of breast: Secondary | ICD-10-CM

## 2016-12-16 DIAGNOSIS — R2681 Unsteadiness on feet: Secondary | ICD-10-CM | POA: Diagnosis not present

## 2016-12-16 DIAGNOSIS — I119 Hypertensive heart disease without heart failure: Secondary | ICD-10-CM | POA: Diagnosis not present

## 2016-12-16 DIAGNOSIS — L299 Pruritus, unspecified: Secondary | ICD-10-CM | POA: Diagnosis not present

## 2016-12-16 DIAGNOSIS — Z0000 Encounter for general adult medical examination without abnormal findings: Secondary | ICD-10-CM | POA: Diagnosis not present

## 2016-12-16 DIAGNOSIS — E785 Hyperlipidemia, unspecified: Secondary | ICD-10-CM | POA: Diagnosis not present

## 2016-12-16 DIAGNOSIS — E559 Vitamin D deficiency, unspecified: Secondary | ICD-10-CM | POA: Diagnosis not present

## 2016-12-16 DIAGNOSIS — G43909 Migraine, unspecified, not intractable, without status migrainosus: Secondary | ICD-10-CM | POA: Diagnosis not present

## 2016-12-16 DIAGNOSIS — J309 Allergic rhinitis, unspecified: Secondary | ICD-10-CM | POA: Diagnosis not present

## 2016-12-16 DIAGNOSIS — I10 Essential (primary) hypertension: Secondary | ICD-10-CM | POA: Diagnosis not present

## 2017-01-13 DIAGNOSIS — I10 Essential (primary) hypertension: Secondary | ICD-10-CM | POA: Diagnosis not present

## 2017-01-13 DIAGNOSIS — J309 Allergic rhinitis, unspecified: Secondary | ICD-10-CM | POA: Diagnosis not present

## 2017-01-13 DIAGNOSIS — E785 Hyperlipidemia, unspecified: Secondary | ICD-10-CM | POA: Diagnosis not present

## 2017-01-13 DIAGNOSIS — Z23 Encounter for immunization: Secondary | ICD-10-CM | POA: Diagnosis not present

## 2017-01-13 DIAGNOSIS — I119 Hypertensive heart disease without heart failure: Secondary | ICD-10-CM | POA: Diagnosis not present

## 2017-01-13 DIAGNOSIS — R2681 Unsteadiness on feet: Secondary | ICD-10-CM | POA: Diagnosis not present

## 2017-01-13 DIAGNOSIS — E559 Vitamin D deficiency, unspecified: Secondary | ICD-10-CM | POA: Diagnosis not present

## 2017-01-13 DIAGNOSIS — L299 Pruritus, unspecified: Secondary | ICD-10-CM | POA: Diagnosis not present

## 2017-01-13 DIAGNOSIS — G43909 Migraine, unspecified, not intractable, without status migrainosus: Secondary | ICD-10-CM | POA: Diagnosis not present

## 2017-01-16 ENCOUNTER — Ambulatory Visit
Admission: RE | Admit: 2017-01-16 | Discharge: 2017-01-16 | Disposition: A | Discharge status: Home / Self Care | Payer: Medicare Other | Source: Ambulatory Visit | Attending: Physician Assistant | Admitting: Physician Assistant

## 2017-01-16 DIAGNOSIS — Z1231 Encounter for screening mammogram for malignant neoplasm of breast: Secondary | ICD-10-CM | POA: Diagnosis not present

## 2017-04-25 DIAGNOSIS — Z131 Encounter for screening for diabetes mellitus: Secondary | ICD-10-CM | POA: Diagnosis not present

## 2017-04-25 DIAGNOSIS — E785 Hyperlipidemia, unspecified: Secondary | ICD-10-CM | POA: Diagnosis not present

## 2017-04-25 DIAGNOSIS — R2681 Unsteadiness on feet: Secondary | ICD-10-CM | POA: Diagnosis not present

## 2017-04-25 DIAGNOSIS — E559 Vitamin D deficiency, unspecified: Secondary | ICD-10-CM | POA: Diagnosis not present

## 2017-04-25 DIAGNOSIS — G43909 Migraine, unspecified, not intractable, without status migrainosus: Secondary | ICD-10-CM | POA: Diagnosis not present

## 2017-04-25 DIAGNOSIS — I119 Hypertensive heart disease without heart failure: Secondary | ICD-10-CM | POA: Diagnosis not present

## 2017-04-25 DIAGNOSIS — Z01118 Encounter for examination of ears and hearing with other abnormal findings: Secondary | ICD-10-CM | POA: Diagnosis not present

## 2017-04-25 DIAGNOSIS — I10 Essential (primary) hypertension: Secondary | ICD-10-CM | POA: Diagnosis not present

## 2017-04-25 DIAGNOSIS — J309 Allergic rhinitis, unspecified: Secondary | ICD-10-CM | POA: Diagnosis not present

## 2017-04-25 DIAGNOSIS — H538 Other visual disturbances: Secondary | ICD-10-CM | POA: Diagnosis not present

## 2017-05-08 DIAGNOSIS — R411 Anterograde amnesia: Secondary | ICD-10-CM | POA: Diagnosis not present

## 2017-05-08 DIAGNOSIS — G8222 Paraplegia, incomplete: Secondary | ICD-10-CM | POA: Diagnosis not present

## 2017-05-08 DIAGNOSIS — G43719 Chronic migraine without aura, intractable, without status migrainosus: Secondary | ICD-10-CM | POA: Diagnosis not present

## 2017-06-09 DIAGNOSIS — F411 Generalized anxiety disorder: Secondary | ICD-10-CM | POA: Diagnosis not present

## 2017-06-09 DIAGNOSIS — G8222 Paraplegia, incomplete: Secondary | ICD-10-CM | POA: Diagnosis not present

## 2017-06-09 DIAGNOSIS — G43719 Chronic migraine without aura, intractable, without status migrainosus: Secondary | ICD-10-CM | POA: Diagnosis not present

## 2017-06-09 DIAGNOSIS — F4312 Post-traumatic stress disorder, chronic: Secondary | ICD-10-CM | POA: Diagnosis not present

## 2018-01-10 DIAGNOSIS — R2681 Unsteadiness on feet: Secondary | ICD-10-CM | POA: Diagnosis not present

## 2018-01-10 DIAGNOSIS — G43909 Migraine, unspecified, not intractable, without status migrainosus: Secondary | ICD-10-CM | POA: Diagnosis not present

## 2018-01-10 DIAGNOSIS — J309 Allergic rhinitis, unspecified: Secondary | ICD-10-CM | POA: Diagnosis not present

## 2018-01-10 DIAGNOSIS — H538 Other visual disturbances: Secondary | ICD-10-CM | POA: Diagnosis not present

## 2018-01-10 DIAGNOSIS — I10 Essential (primary) hypertension: Secondary | ICD-10-CM | POA: Diagnosis not present

## 2018-01-10 DIAGNOSIS — Z131 Encounter for screening for diabetes mellitus: Secondary | ICD-10-CM | POA: Diagnosis not present

## 2018-01-10 DIAGNOSIS — E785 Hyperlipidemia, unspecified: Secondary | ICD-10-CM | POA: Diagnosis not present

## 2018-01-10 DIAGNOSIS — Z0001 Encounter for general adult medical examination with abnormal findings: Secondary | ICD-10-CM | POA: Diagnosis not present

## 2018-01-10 DIAGNOSIS — E559 Vitamin D deficiency, unspecified: Secondary | ICD-10-CM | POA: Diagnosis not present

## 2018-01-10 DIAGNOSIS — I119 Hypertensive heart disease without heart failure: Secondary | ICD-10-CM | POA: Diagnosis not present

## 2018-02-07 DIAGNOSIS — J309 Allergic rhinitis, unspecified: Secondary | ICD-10-CM | POA: Diagnosis not present

## 2018-02-07 DIAGNOSIS — Z23 Encounter for immunization: Secondary | ICD-10-CM | POA: Diagnosis not present

## 2018-02-07 DIAGNOSIS — E785 Hyperlipidemia, unspecified: Secondary | ICD-10-CM | POA: Diagnosis not present

## 2018-02-07 DIAGNOSIS — Z124 Encounter for screening for malignant neoplasm of cervix: Secondary | ICD-10-CM | POA: Diagnosis not present

## 2018-02-07 DIAGNOSIS — R2681 Unsteadiness on feet: Secondary | ICD-10-CM | POA: Diagnosis not present

## 2018-02-07 DIAGNOSIS — I10 Essential (primary) hypertension: Secondary | ICD-10-CM | POA: Diagnosis not present

## 2018-02-07 DIAGNOSIS — I119 Hypertensive heart disease without heart failure: Secondary | ICD-10-CM | POA: Diagnosis not present

## 2018-02-07 DIAGNOSIS — E559 Vitamin D deficiency, unspecified: Secondary | ICD-10-CM | POA: Diagnosis not present

## 2018-02-07 DIAGNOSIS — G43909 Migraine, unspecified, not intractable, without status migrainosus: Secondary | ICD-10-CM | POA: Diagnosis not present

## 2018-02-21 ENCOUNTER — Other Ambulatory Visit: Payer: Self-pay | Admitting: Physician Assistant

## 2018-02-21 DIAGNOSIS — Z1231 Encounter for screening mammogram for malignant neoplasm of breast: Secondary | ICD-10-CM

## 2018-03-07 DIAGNOSIS — I119 Hypertensive heart disease without heart failure: Secondary | ICD-10-CM | POA: Diagnosis not present

## 2018-03-07 DIAGNOSIS — E559 Vitamin D deficiency, unspecified: Secondary | ICD-10-CM | POA: Diagnosis not present

## 2018-03-07 DIAGNOSIS — G43909 Migraine, unspecified, not intractable, without status migrainosus: Secondary | ICD-10-CM | POA: Diagnosis not present

## 2018-03-07 DIAGNOSIS — I10 Essential (primary) hypertension: Secondary | ICD-10-CM | POA: Diagnosis not present

## 2018-03-07 DIAGNOSIS — E785 Hyperlipidemia, unspecified: Secondary | ICD-10-CM | POA: Diagnosis not present

## 2018-03-07 DIAGNOSIS — J309 Allergic rhinitis, unspecified: Secondary | ICD-10-CM | POA: Diagnosis not present

## 2018-03-07 DIAGNOSIS — R2681 Unsteadiness on feet: Secondary | ICD-10-CM | POA: Diagnosis not present

## 2018-03-24 ENCOUNTER — Ambulatory Visit (HOSPITAL_COMMUNITY)
Admission: EM | Admit: 2018-03-24 | Discharge: 2018-03-24 | Disposition: A | Discharge status: Home / Self Care | Payer: Medicare Other | Attending: Family Medicine | Admitting: Family Medicine

## 2018-03-24 ENCOUNTER — Encounter (HOSPITAL_COMMUNITY): Payer: Self-pay | Admitting: Emergency Medicine

## 2018-03-24 DIAGNOSIS — J069 Acute upper respiratory infection, unspecified: Secondary | ICD-10-CM | POA: Diagnosis not present

## 2018-03-24 DIAGNOSIS — B9789 Other viral agents as the cause of diseases classified elsewhere: Secondary | ICD-10-CM | POA: Insufficient documentation

## 2018-03-24 DIAGNOSIS — R0982 Postnasal drip: Secondary | ICD-10-CM | POA: Diagnosis not present

## 2018-03-24 MED ORDER — FLUTICASONE PROPIONATE 50 MCG/ACT NA SUSP: 1.0000 | Freq: Every day | NASAL | 2 refills | Status: AC

## 2018-03-24 MED ORDER — BENZONATATE 200 MG PO CAPS: 200.0000 mg | ORAL_CAPSULE | Freq: Three times a day (TID) | ORAL | 0 refills | Status: DC | PRN

## 2018-03-24 NOTE — Discharge Instructions (Signed)
Cool mist humidifier at night Flonase at night Honey is great daily -1 tablespoon Robitussin DM during day and nyquil at night  Sending in tessalon pearls for cough. Can take up to three times a day as needed.   If fever/worsening symptoms/shortness of breath or prolonged cough f/u with your PCP

## 2018-03-24 NOTE — ED Provider Notes (Signed)
Patient: Krystal GrahamJoann Duet MRN: [id number] DOB: 03/02/1966 PCP: Norm SaltVanstory, Ashley N, PA     Subjective:  Chief Complaint  Patient presents with  . URI    HPI: The patient is a 52 y.o. female who presents today for Glenford PeersUri symptoms that started last Saturday. She started with sore throat and running nose, congestion, cough that has production of some clear mucous at times . She has intermittent sinus pain and pressure. She has no shortness or breath or wheezing. No history of asthma and she is not a smoker and not around second hand smoke. She has been taking cough medication (nyquil/thera flu), vitamin C. She has no known sick contacts. No fever/chills.   Review of Systems  Constitutional: Negative for chills, diaphoresis and fever.  HENT: Positive for congestion and sore throat. Negative for ear pain, rhinorrhea, sinus pressure and sinus pain.   Eyes: Negative for itching.  Respiratory: Positive for cough. Negative for shortness of breath and wheezing.   Cardiovascular: Negative for chest pain and palpitations.  Gastrointestinal: Negative for abdominal pain, diarrhea, nausea and vomiting.  Genitourinary: Negative for dysuria.    Allergies Patient has No Known Allergies.  Past Medical History Patient  has a past medical history of Hypertension and Polio.  Surgical History Patient  has a past surgical history that includes Leg Surgery and Wrist surgery.  Family History Pateint's family history is not on file.  Social History Patient  reports that she has been smoking. She does not have any smokeless tobacco history on file. She reports that she does not drink alcohol or use drugs.    Objective: Vitals:   03/24/18 1726 03/24/18 1748  BP: (!) 156/92 127/82  Pulse: (!) 108   Resp: 18   Temp: 97.9 F (36.6 C)   TempSrc: Oral   SpO2: 96%      There is no height or weight on file to calculate BMI.  Physical Exam Vitals signs reviewed.  Constitutional:      Appearance: Normal  appearance. She is not ill-appearing or toxic-appearing.  HENT:     Head:     Comments: +cobblestoning on posterior pharynx     Right Ear: Tympanic membrane, ear canal and external ear normal.     Left Ear: Tympanic membrane, ear canal and external ear normal.     Nose: Nose normal.  Neck:     Musculoskeletal: Normal range of motion.  Cardiovascular:     Rate and Rhythm: Normal rate and regular rhythm.     Heart sounds: Normal heart sounds.  Pulmonary:     Effort: Pulmonary effort is normal. No respiratory distress.     Breath sounds: Normal breath sounds. No wheezing or rales.  Abdominal:     General: Abdomen is flat. Bowel sounds are normal.     Palpations: Abdomen is soft.  Lymphadenopathy:     Cervical: No cervical adenopathy.  Skin:    General: Skin is warm.     Capillary Refill: Capillary refill takes less than 2 seconds.  Neurological:     Mental Status: She is alert.  Psychiatric:        Mood and Affect: Mood normal.        Behavior: Behavior normal.        Assessment/plan: Viral URI with cough -conservative therapy at this time. No indication for abx. Recommended cool mist humidifier, honey, robitussin DM, nyquil. Will send in tessalon pearls prn. Precautions given for fever/worsening symptoms, shortness of breath to return or follow up  with pcp.   Post-nasal drip -start flonase QHS. Handout given.   -repeat BP to goal.     Orland Mustard, MD  03/24/2018     Orland Mustard, MD 03/24/18 1755

## 2018-03-24 NOTE — ED Triage Notes (Signed)
Pt here for URI sx  

## 2018-04-06 ENCOUNTER — Ambulatory Visit: Payer: Medicare Other

## 2018-05-10 ENCOUNTER — Ambulatory Visit
Admission: RE | Admit: 2018-05-10 | Discharge: 2018-05-10 | Disposition: A | Discharge status: Home / Self Care | Payer: Medicare Other | Source: Ambulatory Visit | Attending: Physician Assistant | Admitting: Physician Assistant

## 2018-05-10 DIAGNOSIS — Z1231 Encounter for screening mammogram for malignant neoplasm of breast: Secondary | ICD-10-CM

## 2018-06-15 DIAGNOSIS — E785 Hyperlipidemia, unspecified: Secondary | ICD-10-CM | POA: Diagnosis not present

## 2018-06-15 DIAGNOSIS — M25531 Pain in right wrist: Secondary | ICD-10-CM | POA: Diagnosis not present

## 2018-06-15 DIAGNOSIS — J309 Allergic rhinitis, unspecified: Secondary | ICD-10-CM | POA: Diagnosis not present

## 2018-06-15 DIAGNOSIS — I10 Essential (primary) hypertension: Secondary | ICD-10-CM | POA: Diagnosis not present

## 2018-06-15 DIAGNOSIS — G43909 Migraine, unspecified, not intractable, without status migrainosus: Secondary | ICD-10-CM | POA: Diagnosis not present

## 2018-06-15 DIAGNOSIS — R2681 Unsteadiness on feet: Secondary | ICD-10-CM | POA: Diagnosis not present

## 2018-06-15 DIAGNOSIS — E559 Vitamin D deficiency, unspecified: Secondary | ICD-10-CM | POA: Diagnosis not present

## 2018-06-15 DIAGNOSIS — I119 Hypertensive heart disease without heart failure: Secondary | ICD-10-CM | POA: Diagnosis not present

## 2018-08-22 DIAGNOSIS — E559 Vitamin D deficiency, unspecified: Secondary | ICD-10-CM | POA: Diagnosis not present

## 2018-08-22 DIAGNOSIS — R2681 Unsteadiness on feet: Secondary | ICD-10-CM | POA: Diagnosis not present

## 2018-08-22 DIAGNOSIS — I10 Essential (primary) hypertension: Secondary | ICD-10-CM | POA: Diagnosis not present

## 2018-08-22 DIAGNOSIS — G43909 Migraine, unspecified, not intractable, without status migrainosus: Secondary | ICD-10-CM | POA: Diagnosis not present

## 2018-08-22 DIAGNOSIS — M25531 Pain in right wrist: Secondary | ICD-10-CM | POA: Diagnosis not present

## 2018-08-22 DIAGNOSIS — J309 Allergic rhinitis, unspecified: Secondary | ICD-10-CM | POA: Diagnosis not present

## 2018-08-22 DIAGNOSIS — E785 Hyperlipidemia, unspecified: Secondary | ICD-10-CM | POA: Diagnosis not present

## 2018-08-22 DIAGNOSIS — I119 Hypertensive heart disease without heart failure: Secondary | ICD-10-CM | POA: Diagnosis not present

## 2018-08-29 DIAGNOSIS — I119 Hypertensive heart disease without heart failure: Secondary | ICD-10-CM | POA: Diagnosis not present

## 2018-08-29 DIAGNOSIS — R2681 Unsteadiness on feet: Secondary | ICD-10-CM | POA: Diagnosis not present

## 2018-08-29 DIAGNOSIS — M25531 Pain in right wrist: Secondary | ICD-10-CM | POA: Diagnosis not present

## 2018-08-29 DIAGNOSIS — I10 Essential (primary) hypertension: Secondary | ICD-10-CM | POA: Diagnosis not present

## 2018-08-29 DIAGNOSIS — G43909 Migraine, unspecified, not intractable, without status migrainosus: Secondary | ICD-10-CM | POA: Diagnosis not present

## 2018-08-29 DIAGNOSIS — E785 Hyperlipidemia, unspecified: Secondary | ICD-10-CM | POA: Diagnosis not present

## 2018-08-29 DIAGNOSIS — J309 Allergic rhinitis, unspecified: Secondary | ICD-10-CM | POA: Diagnosis not present

## 2018-08-29 DIAGNOSIS — E559 Vitamin D deficiency, unspecified: Secondary | ICD-10-CM | POA: Diagnosis not present

## 2018-09-07 DIAGNOSIS — E559 Vitamin D deficiency, unspecified: Secondary | ICD-10-CM | POA: Diagnosis not present

## 2018-09-07 DIAGNOSIS — M25531 Pain in right wrist: Secondary | ICD-10-CM | POA: Diagnosis not present

## 2018-09-07 DIAGNOSIS — I119 Hypertensive heart disease without heart failure: Secondary | ICD-10-CM | POA: Diagnosis not present

## 2018-09-07 DIAGNOSIS — R2681 Unsteadiness on feet: Secondary | ICD-10-CM | POA: Diagnosis not present

## 2018-09-07 DIAGNOSIS — J309 Allergic rhinitis, unspecified: Secondary | ICD-10-CM | POA: Diagnosis not present

## 2018-09-07 DIAGNOSIS — I10 Essential (primary) hypertension: Secondary | ICD-10-CM | POA: Diagnosis not present

## 2018-09-07 DIAGNOSIS — G43909 Migraine, unspecified, not intractable, without status migrainosus: Secondary | ICD-10-CM | POA: Diagnosis not present

## 2018-09-07 DIAGNOSIS — E785 Hyperlipidemia, unspecified: Secondary | ICD-10-CM | POA: Diagnosis not present

## 2018-12-07 DIAGNOSIS — I119 Hypertensive heart disease without heart failure: Secondary | ICD-10-CM | POA: Diagnosis not present

## 2018-12-07 DIAGNOSIS — E559 Vitamin D deficiency, unspecified: Secondary | ICD-10-CM | POA: Diagnosis not present

## 2018-12-07 DIAGNOSIS — M25531 Pain in right wrist: Secondary | ICD-10-CM | POA: Diagnosis not present

## 2018-12-07 DIAGNOSIS — J309 Allergic rhinitis, unspecified: Secondary | ICD-10-CM | POA: Diagnosis not present

## 2018-12-07 DIAGNOSIS — I10 Essential (primary) hypertension: Secondary | ICD-10-CM | POA: Diagnosis not present

## 2018-12-07 DIAGNOSIS — R2681 Unsteadiness on feet: Secondary | ICD-10-CM | POA: Diagnosis not present

## 2018-12-07 DIAGNOSIS — E785 Hyperlipidemia, unspecified: Secondary | ICD-10-CM | POA: Diagnosis not present

## 2018-12-07 DIAGNOSIS — G43909 Migraine, unspecified, not intractable, without status migrainosus: Secondary | ICD-10-CM | POA: Diagnosis not present

## 2019-01-18 DIAGNOSIS — E785 Hyperlipidemia, unspecified: Secondary | ICD-10-CM | POA: Diagnosis not present

## 2019-01-18 DIAGNOSIS — E559 Vitamin D deficiency, unspecified: Secondary | ICD-10-CM | POA: Diagnosis not present

## 2019-01-18 DIAGNOSIS — I119 Hypertensive heart disease without heart failure: Secondary | ICD-10-CM | POA: Diagnosis not present

## 2019-01-18 DIAGNOSIS — I10 Essential (primary) hypertension: Secondary | ICD-10-CM | POA: Diagnosis not present

## 2019-01-18 DIAGNOSIS — R2681 Unsteadiness on feet: Secondary | ICD-10-CM | POA: Diagnosis not present

## 2019-01-18 DIAGNOSIS — Z23 Encounter for immunization: Secondary | ICD-10-CM | POA: Diagnosis not present

## 2019-01-18 DIAGNOSIS — Z0001 Encounter for general adult medical examination with abnormal findings: Secondary | ICD-10-CM | POA: Diagnosis not present

## 2019-01-18 DIAGNOSIS — Z1389 Encounter for screening for other disorder: Secondary | ICD-10-CM | POA: Diagnosis not present

## 2019-01-18 DIAGNOSIS — G43909 Migraine, unspecified, not intractable, without status migrainosus: Secondary | ICD-10-CM | POA: Diagnosis not present

## 2019-01-18 DIAGNOSIS — J309 Allergic rhinitis, unspecified: Secondary | ICD-10-CM | POA: Diagnosis not present

## 2019-01-18 DIAGNOSIS — M25531 Pain in right wrist: Secondary | ICD-10-CM | POA: Diagnosis not present

## 2019-07-08 DIAGNOSIS — I10 Essential (primary) hypertension: Secondary | ICD-10-CM | POA: Diagnosis not present

## 2019-07-08 DIAGNOSIS — E785 Hyperlipidemia, unspecified: Secondary | ICD-10-CM | POA: Diagnosis not present

## 2019-07-08 DIAGNOSIS — J301 Allergic rhinitis due to pollen: Secondary | ICD-10-CM | POA: Diagnosis not present

## 2019-07-08 DIAGNOSIS — M19039 Primary osteoarthritis, unspecified wrist: Secondary | ICD-10-CM | POA: Diagnosis not present

## 2019-08-09 DIAGNOSIS — J301 Allergic rhinitis due to pollen: Secondary | ICD-10-CM | POA: Diagnosis not present

## 2019-08-09 DIAGNOSIS — I10 Essential (primary) hypertension: Secondary | ICD-10-CM | POA: Diagnosis not present

## 2019-08-09 DIAGNOSIS — E785 Hyperlipidemia, unspecified: Secondary | ICD-10-CM | POA: Diagnosis not present

## 2019-09-02 ENCOUNTER — Other Ambulatory Visit: Payer: Self-pay | Admitting: Internal Medicine

## 2019-09-02 ENCOUNTER — Other Ambulatory Visit: Payer: Self-pay | Admitting: Physician Assistant

## 2019-09-02 DIAGNOSIS — Z1231 Encounter for screening mammogram for malignant neoplasm of breast: Secondary | ICD-10-CM

## 2019-09-13 ENCOUNTER — Ambulatory Visit
Admission: RE | Admit: 2019-09-13 | Discharge: 2019-09-13 | Disposition: A | Discharge status: Home / Self Care | Payer: Medicare HMO | Source: Ambulatory Visit | Attending: Internal Medicine | Admitting: Internal Medicine

## 2019-09-13 ENCOUNTER — Other Ambulatory Visit: Payer: Self-pay

## 2019-09-13 DIAGNOSIS — Z1231 Encounter for screening mammogram for malignant neoplasm of breast: Secondary | ICD-10-CM | POA: Diagnosis not present

## 2019-10-19 DIAGNOSIS — Z23 Encounter for immunization: Secondary | ICD-10-CM | POA: Diagnosis not present

## 2020-02-07 DIAGNOSIS — R2681 Unsteadiness on feet: Secondary | ICD-10-CM | POA: Diagnosis not present

## 2020-02-07 DIAGNOSIS — M170 Bilateral primary osteoarthritis of knee: Secondary | ICD-10-CM | POA: Diagnosis not present

## 2020-02-07 DIAGNOSIS — Z23 Encounter for immunization: Secondary | ICD-10-CM | POA: Diagnosis not present

## 2020-02-07 DIAGNOSIS — E785 Hyperlipidemia, unspecified: Secondary | ICD-10-CM | POA: Diagnosis not present

## 2020-05-08 DIAGNOSIS — R2689 Other abnormalities of gait and mobility: Secondary | ICD-10-CM | POA: Diagnosis not present

## 2020-05-08 DIAGNOSIS — M170 Bilateral primary osteoarthritis of knee: Secondary | ICD-10-CM | POA: Diagnosis not present

## 2020-05-08 DIAGNOSIS — I10 Essential (primary) hypertension: Secondary | ICD-10-CM | POA: Diagnosis not present

## 2020-05-11 DIAGNOSIS — R2681 Unsteadiness on feet: Secondary | ICD-10-CM | POA: Diagnosis not present

## 2020-05-11 DIAGNOSIS — R2689 Other abnormalities of gait and mobility: Secondary | ICD-10-CM | POA: Diagnosis not present

## 2020-05-11 DIAGNOSIS — M170 Bilateral primary osteoarthritis of knee: Secondary | ICD-10-CM | POA: Diagnosis not present

## 2020-07-10 ENCOUNTER — Ambulatory Visit (HOSPITAL_COMMUNITY)
Admission: EM | Admit: 2020-07-10 | Discharge: 2020-07-10 | Disposition: A | Discharge status: Home / Self Care | Payer: Medicare HMO

## 2020-07-10 ENCOUNTER — Encounter (HOSPITAL_COMMUNITY): Payer: Self-pay

## 2020-07-10 ENCOUNTER — Other Ambulatory Visit: Payer: Self-pay

## 2020-07-10 DIAGNOSIS — M7021 Olecranon bursitis, right elbow: Secondary | ICD-10-CM | POA: Diagnosis not present

## 2020-07-10 NOTE — Discharge Instructions (Addendum)
You may find an elbow sleeve works better to help alleviate your discomfort but we do not have these available today but these are available at most pharmacies and online for purchase

## 2020-07-10 NOTE — ED Triage Notes (Signed)
Pt presents with right elbow pain x 1 week. Pain is worse when moving the arm.  Denies any injury.

## 2020-07-10 NOTE — ED Provider Notes (Signed)
[facility]    CSN: [id number] Arrival date & time: 07/10/20  1753      History   Chief Complaint Chief Complaint  Patient presents with  . Elbow Pain    HPI Krystal Li is a 55 y.o. female.   HPI   Elbow pain: Patient reports that for the past week she has had right elbow pain.  No known injury.  She does mention that she uses his elbow to rest often and also uses it to help support herself as she tries to get up and use her walker.  She has noticed some very mild swelling of this area but has not noticed any significant changes.  Range of motion of the elbow does hurt slightly but the pain mainly occurs when she tries to rest the elbow on something.  She has tried what she describes similar to a Voltaren cream which has helped symptoms some.  No numbness or tingling of the hands.  Past Medical History:  Diagnosis Date  . Hypertension   . Polio     There are no problems to display for this patient.   Past Surgical History:  Procedure Laterality Date  . LEG SURGERY    . WRIST SURGERY      OB History   No obstetric history on file.      Home Medications    Prior to Admission medications   Medication Sig Start Date End Date Taking? Authorizing Provider  metoprolol succinate (TOPROL-XL) 25 MG 24 hr tablet 1 tablet 08/09/19  Yes [provider]  albuterol (PROVENTIL HFA;VENTOLIN HFA) 108 (90 BASE) MCG/ACT inhaler Inhale 1-2 puffs into the lungs every 6 (six) hours as needed for wheezing. 06/07/12   Charlynne Pander, MD  benzonatate (TESSALON) 200 MG capsule Take 1 capsule (200 mg total) by mouth 3 (three) times daily as needed for cough. 03/24/18   Orland Mustard, MD  cholecalciferol (VITAMIN D) 1000 UNITS tablet Take 1,000 Units by mouth daily.    [provider]  diclofenac (VOLTAREN) 75 MG EC tablet Take 75 mg by mouth 2 (two) times daily as needed for mild pain.    [provider]  diphenhydrAMINE (SOMINEX) 25 MG tablet Take 25  mg by mouth at bedtime as needed for itching or sleep.    [provider]  fluticasone (FLONASE) 50 MCG/ACT nasal spray Place 1 spray into both nostrils daily. 03/24/18   Orland Mustard, MD  HYDROcodone-acetaminophen (NORCO/VICODIN) 5-325 MG tablet Take 1 tablet by mouth 2 (two) times daily.    [provider]  lisinopril (PRINIVIL,ZESTRIL) 20 MG tablet Take 20 mg by mouth daily.    [provider]  metoCLOPramide (REGLAN) 10 MG tablet Take 1 tablet (10 mg total) by mouth every 6 (six) hours as needed (nausea/headache). Patient not taking: No sig reported 06/07/12   Charlynne Pander, MD  montelukast (SINGULAIR) 10 MG tablet Take 10 mg by mouth daily.    [provider]  ondansetron (ZOFRAN) 4 MG tablet Take 1 tablet (4 mg total) by mouth every 6 (six) hours. 05/19/15   Phillis Haggis, MD  pravastatin (PRAVACHOL) 40 MG tablet Take 40 mg by mouth daily.    [provider]    Family History History reviewed. No pertinent family history.  Social History Social History   Tobacco Use  . Smoking status: Current Some Day Smoker  . Smokeless tobacco: Never Used  Substance Use Topics  . Alcohol use: No  . Drug  use: No     Allergies   Patient has no known allergies.   Review of Systems Review of Systems  As stated above in HPI Physical Exam Triage Vital Signs ED Triage Vitals  Enc Vitals Group     BP 07/10/20 1856 (!) 153/107     Pulse Rate 07/10/20 1856 96     Resp 07/10/20 1856 (!) 21     Temp 07/10/20 1856 97.9 F (36.6 C)     Temp Source 07/10/20 1856 Oral     SpO2 07/10/20 1856 100 %     Weight --      Height --      Head Circumference --      Peak Flow --      Pain Score 07/10/20 1852 5     Pain Loc --      Pain Edu? --      Excl. in GC? --    No data found.  Updated Vital Signs BP (!) 153/107 (BP Location: Right Arm)   Pulse 96   Temp 97.9 F (36.6 C) (Oral)   Resp (!) 21   LMP 11/21/2012   SpO2 100%   Physical  Exam Vitals and nursing note reviewed.  Constitutional:      General: She is not in acute distress.    Appearance: Normal appearance. She is not ill-appearing, toxic-appearing or diaphoretic.  Musculoskeletal:        General: Tenderness (of the right olecranon bursa ) present. No swelling or deformity. Normal range of motion.  Skin:    General: Skin is warm.     Capillary Refill: Capillary refill takes less than 2 seconds.     Findings: No bruising, erythema or rash.  Neurological:     Mental Status: She is alert.      UC Treatments / Results  Labs (all labs ordered are listed, but only abnormal results are displayed) Labs Reviewed - No data to display  EKG   Radiology No results found.  Procedures Procedures (including critical care time)  Medications Ordered in UC Medications - No data to display  Initial Impression / Assessment and Plan / UC Course  I have reviewed the triage vital signs and the nursing notes.  Pertinent labs & imaging results that were available during my care of the patient were reviewed by me and considered in my medical decision making (see chart for details).     New.  Discussed olecranon bursitis with patient.  We have an Ace wrap that is available today but she may find that an elbow support sleeve his more comfortable.  We discussed prevention of applying pressure to this area to help with pain.  She can continue her Voltaren gel.  She can also use Tylenol as needed for her discomfort.  Discussed red flag signs and symptoms.  Final Clinical Impressions(s) / UC Diagnoses   Final diagnoses:  None   Discharge Instructions   None    ED Prescriptions    None     PDMP not reviewed this encounter.   Rushie Chestnut, [place] 07/10/20 1911

## 2020-09-21 DIAGNOSIS — E785 Hyperlipidemia, unspecified: Secondary | ICD-10-CM | POA: Diagnosis not present

## 2020-09-21 DIAGNOSIS — Z1211 Encounter for screening for malignant neoplasm of colon: Secondary | ICD-10-CM | POA: Diagnosis not present

## 2020-09-21 DIAGNOSIS — R2681 Unsteadiness on feet: Secondary | ICD-10-CM | POA: Diagnosis not present

## 2020-09-21 DIAGNOSIS — Z0000 Encounter for general adult medical examination without abnormal findings: Secondary | ICD-10-CM | POA: Diagnosis not present

## 2020-09-21 DIAGNOSIS — I10 Essential (primary) hypertension: Secondary | ICD-10-CM | POA: Diagnosis not present

## 2020-09-21 DIAGNOSIS — Z1231 Encounter for screening mammogram for malignant neoplasm of breast: Secondary | ICD-10-CM | POA: Diagnosis not present

## 2020-09-21 DIAGNOSIS — R262 Difficulty in walking, not elsewhere classified: Secondary | ICD-10-CM | POA: Diagnosis not present

## 2020-09-21 DIAGNOSIS — M170 Bilateral primary osteoarthritis of knee: Secondary | ICD-10-CM | POA: Diagnosis not present

## 2021-02-02 DIAGNOSIS — E785 Hyperlipidemia, unspecified: Secondary | ICD-10-CM | POA: Diagnosis not present

## 2021-02-02 DIAGNOSIS — G14 Postpolio syndrome: Secondary | ICD-10-CM | POA: Diagnosis not present

## 2021-02-02 DIAGNOSIS — M545 Low back pain, unspecified: Secondary | ICD-10-CM | POA: Diagnosis not present

## 2021-02-02 DIAGNOSIS — R262 Difficulty in walking, not elsewhere classified: Secondary | ICD-10-CM | POA: Diagnosis not present

## 2021-02-02 DIAGNOSIS — I10 Essential (primary) hypertension: Secondary | ICD-10-CM | POA: Diagnosis not present

## 2021-02-02 DIAGNOSIS — M170 Bilateral primary osteoarthritis of knee: Secondary | ICD-10-CM | POA: Diagnosis not present

## 2021-02-02 DIAGNOSIS — Z23 Encounter for immunization: Secondary | ICD-10-CM | POA: Diagnosis not present

## 2021-02-16 DIAGNOSIS — I10 Essential (primary) hypertension: Secondary | ICD-10-CM | POA: Diagnosis not present

## 2021-02-16 DIAGNOSIS — R2681 Unsteadiness on feet: Secondary | ICD-10-CM | POA: Diagnosis not present

## 2021-02-16 DIAGNOSIS — R2689 Other abnormalities of gait and mobility: Secondary | ICD-10-CM | POA: Diagnosis not present

## 2021-02-16 DIAGNOSIS — M160 Bilateral primary osteoarthritis of hip: Secondary | ICD-10-CM | POA: Diagnosis not present

## 2021-03-22 DIAGNOSIS — R2689 Other abnormalities of gait and mobility: Secondary | ICD-10-CM | POA: Diagnosis not present

## 2021-03-22 DIAGNOSIS — I10 Essential (primary) hypertension: Secondary | ICD-10-CM | POA: Diagnosis not present

## 2021-10-05 IMAGING — MG DIGITAL SCREENING BILAT W/ TOMO W/ CAD
8 series · 8 of 24 positions shown · non-contrast
Comparison: Previous exam(s).

CLINICAL DATA: Screening.

EXAM:
DIGITAL SCREENING BILATERAL MAMMOGRAM WITH TOMO AND CAD

[R MLO synth-2D]
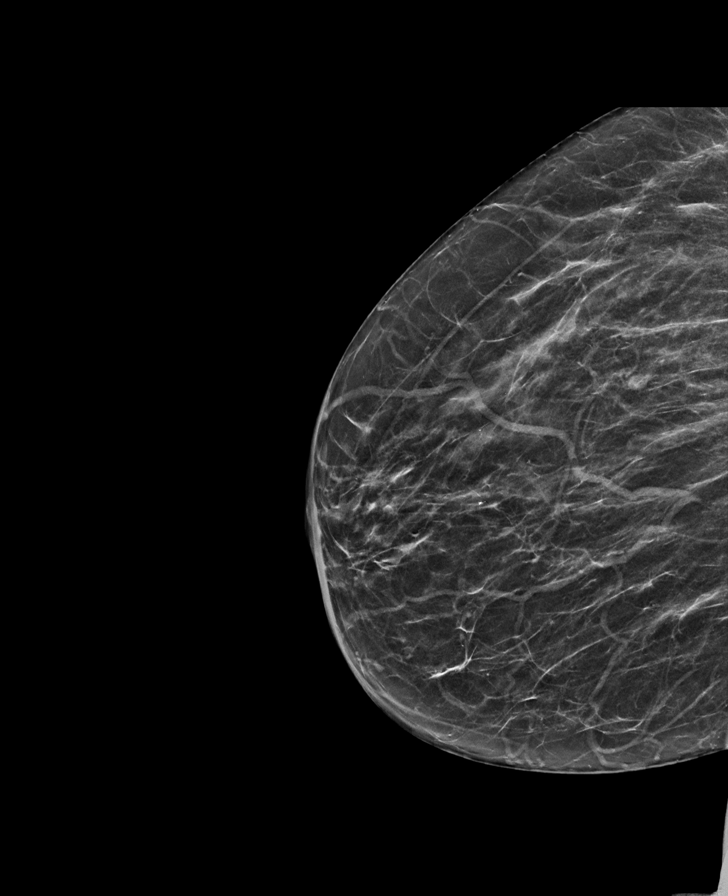

[L CC synth-2D]
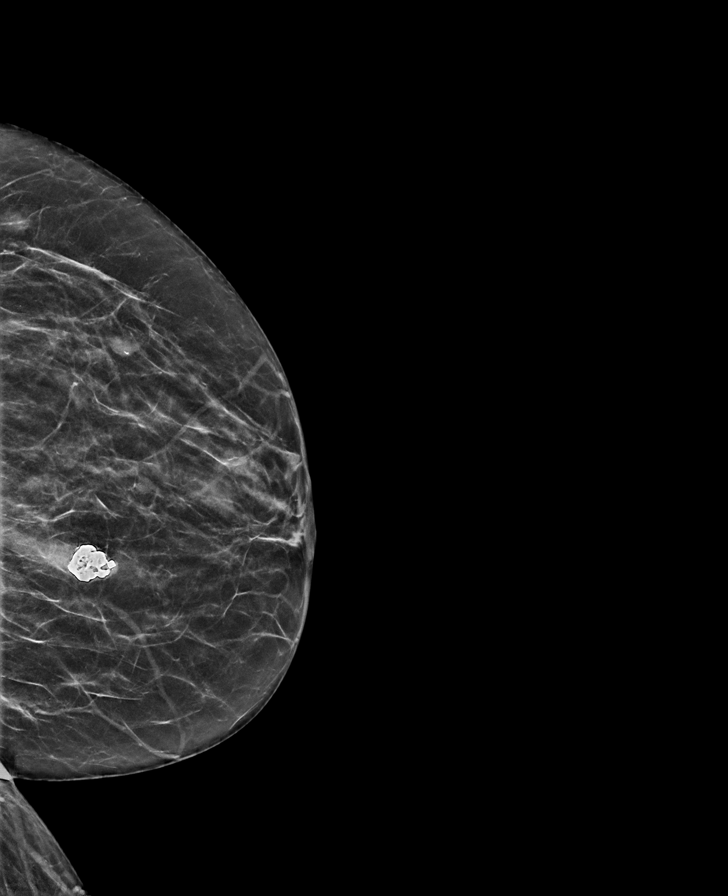

[L MLO synth-2D]
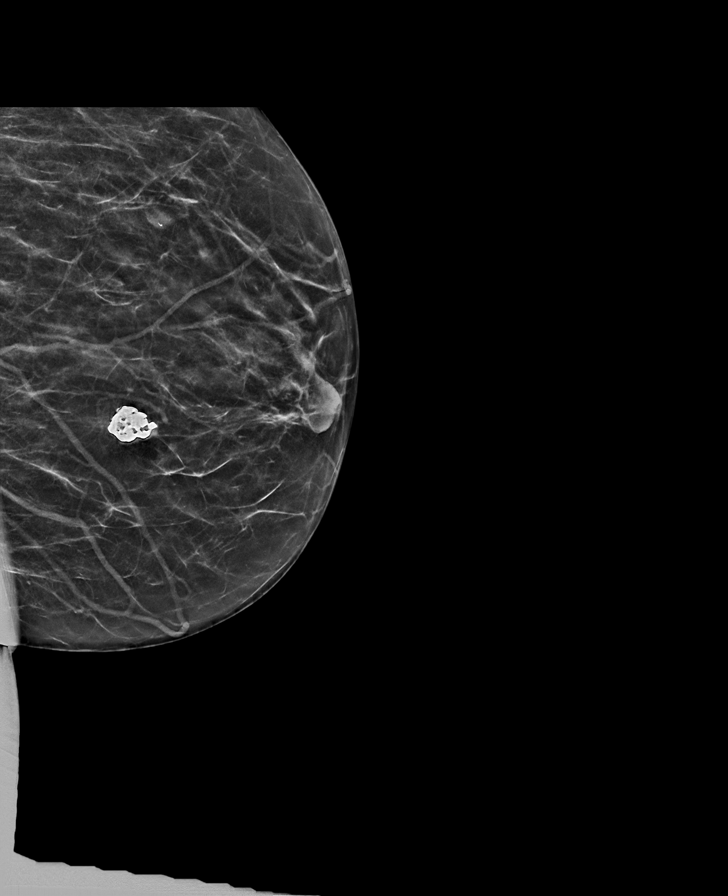

[R CC synth-2D]
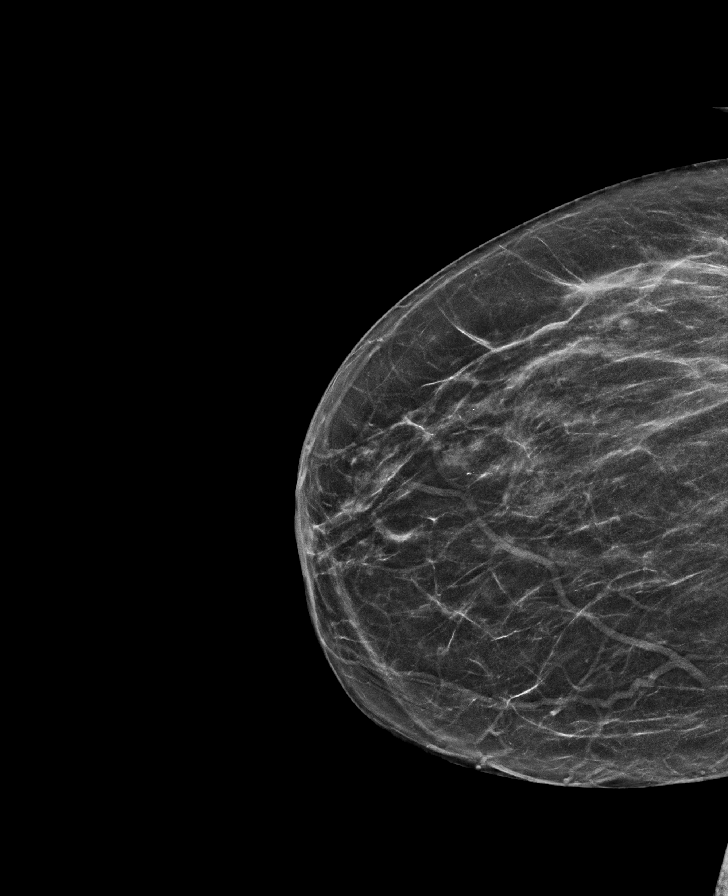

[R MLO tomo · tomo slice 27/53.0]
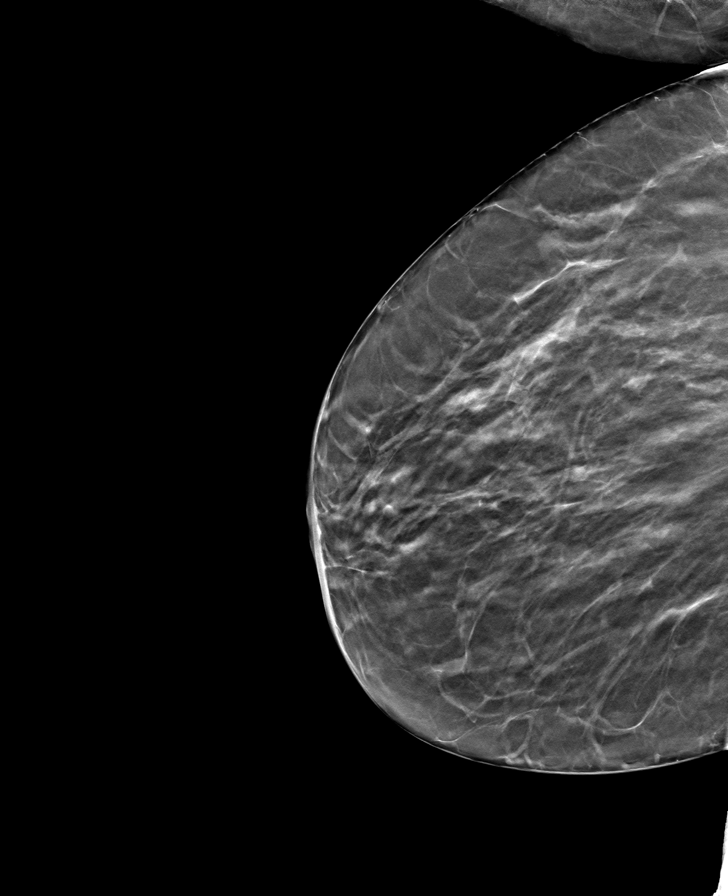

[L CC tomo · tomo slice 27/53.0]
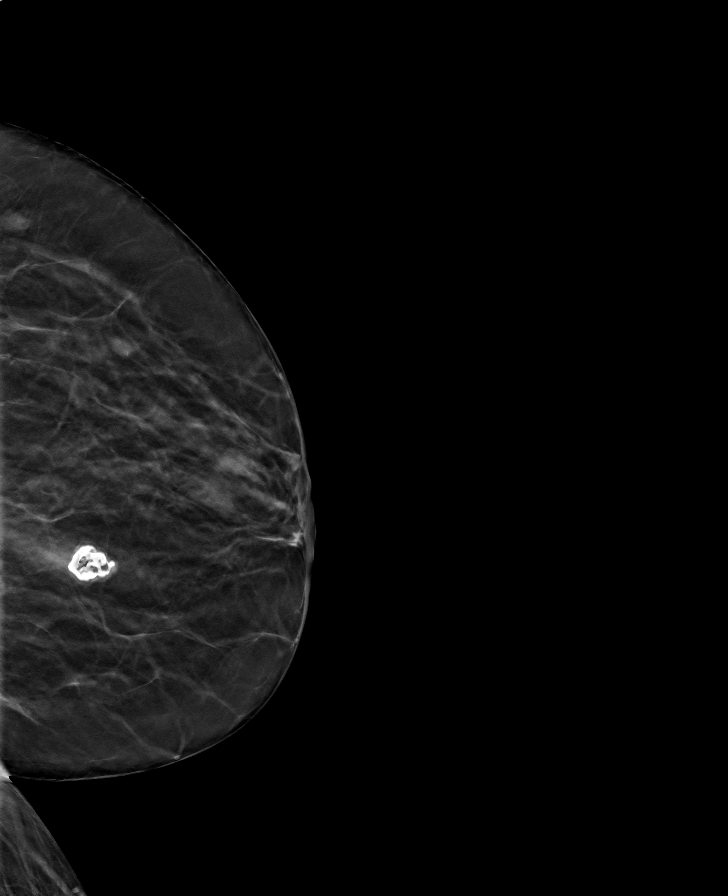

[L MLO tomo · tomo slice 26/51.0]
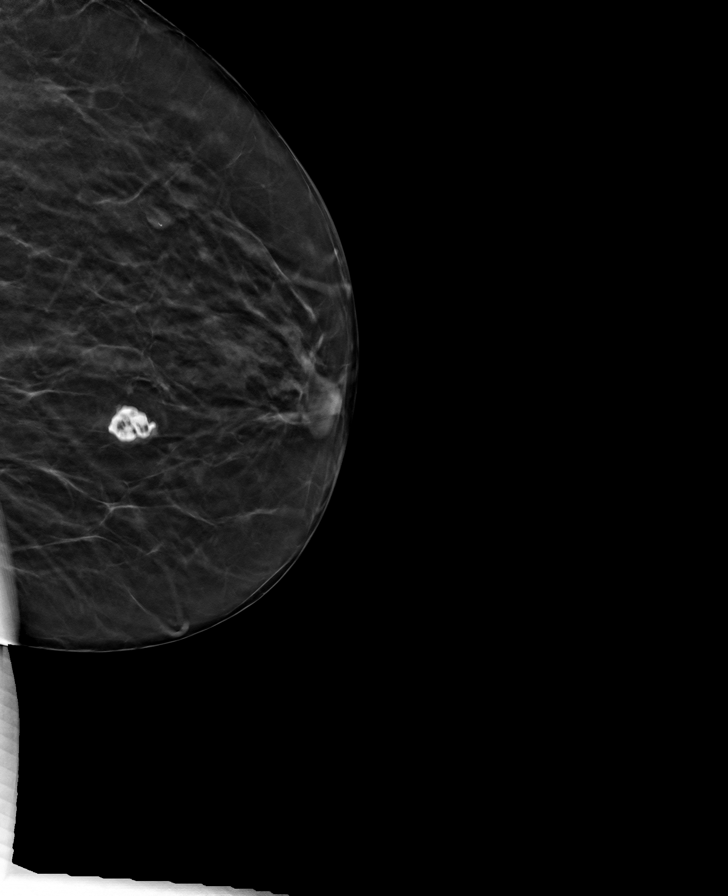

[R CC tomo · tomo slice 29/56.0]
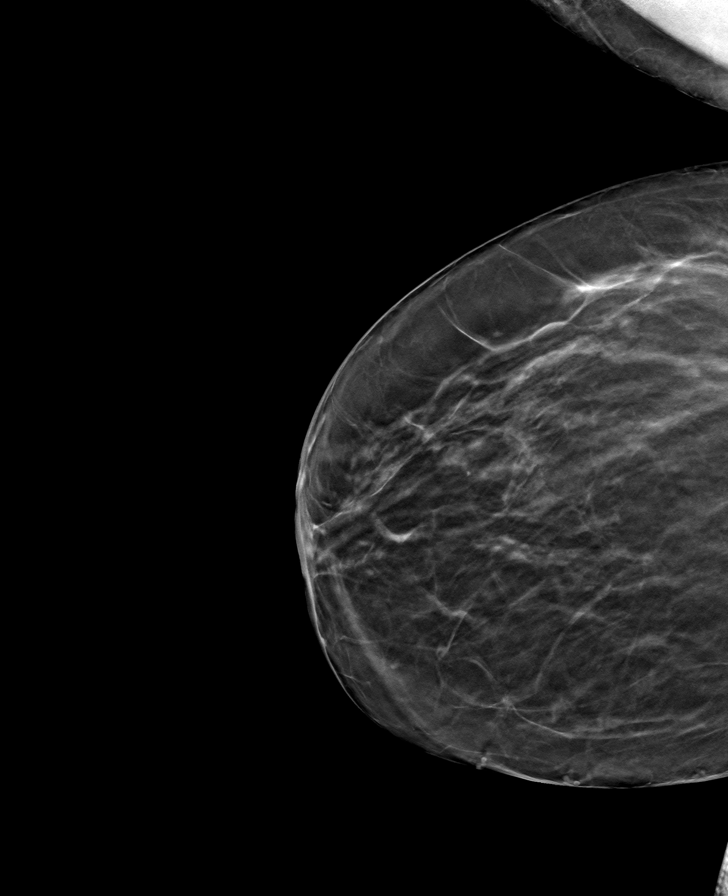

[8 of 24 positions shown; findings below may reference images not displayed]

ACR Breast Density Category b: There are scattered areas of
fibroglandular density.
FINDINGS: There are no findings suspicious for malignancy. Images were
processed with CAD. (Please note that the images are of less than
optimal positioning due to the patient's inability to stand for the
examination).
IMPRESSION: No mammographic evidence of malignancy. A result letter of this
screening mammogram will be mailed directly to the patient.

RECOMMENDATION:
Screening mammogram in one year. (Code:P7-1-2JQ)

BI-RADS CATEGORY  1: Negative.

## 2021-10-18 DIAGNOSIS — Z1211 Encounter for screening for malignant neoplasm of colon: Secondary | ICD-10-CM | POA: Diagnosis not present

## 2021-10-18 DIAGNOSIS — Z1331 Encounter for screening for depression: Secondary | ICD-10-CM | POA: Diagnosis not present

## 2021-10-18 DIAGNOSIS — Z1231 Encounter for screening mammogram for malignant neoplasm of breast: Secondary | ICD-10-CM | POA: Diagnosis not present

## 2021-10-18 DIAGNOSIS — F17210 Nicotine dependence, cigarettes, uncomplicated: Secondary | ICD-10-CM | POA: Diagnosis not present

## 2021-10-18 DIAGNOSIS — E785 Hyperlipidemia, unspecified: Secondary | ICD-10-CM | POA: Diagnosis not present

## 2021-10-18 DIAGNOSIS — I10 Essential (primary) hypertension: Secondary | ICD-10-CM | POA: Diagnosis not present

## 2021-10-18 DIAGNOSIS — M160 Bilateral primary osteoarthritis of hip: Secondary | ICD-10-CM | POA: Diagnosis not present

## 2021-10-18 DIAGNOSIS — R2681 Unsteadiness on feet: Secondary | ICD-10-CM | POA: Diagnosis not present

## 2021-10-18 DIAGNOSIS — Z0000 Encounter for general adult medical examination without abnormal findings: Secondary | ICD-10-CM | POA: Diagnosis not present

## 2021-10-19 ENCOUNTER — Other Ambulatory Visit: Payer: Self-pay | Admitting: Internal Medicine

## 2021-10-19 DIAGNOSIS — Z1231 Encounter for screening mammogram for malignant neoplasm of breast: Secondary | ICD-10-CM

## 2021-11-09 ENCOUNTER — Ambulatory Visit: Payer: Medicare HMO

## 2021-11-18 ENCOUNTER — Ambulatory Visit
Admission: RE | Admit: 2021-11-18 | Discharge: 2021-11-18 | Disposition: A | Discharge status: Home / Self Care | Payer: Medicare HMO | Source: Ambulatory Visit | Attending: Internal Medicine | Admitting: Internal Medicine

## 2021-11-18 DIAGNOSIS — Z1231 Encounter for screening mammogram for malignant neoplasm of breast: Secondary | ICD-10-CM | POA: Diagnosis not present

## 2022-04-27 DIAGNOSIS — Z23 Encounter for immunization: Secondary | ICD-10-CM | POA: Diagnosis not present

## 2022-04-27 DIAGNOSIS — G14 Postpolio syndrome: Secondary | ICD-10-CM | POA: Diagnosis not present

## 2022-04-27 DIAGNOSIS — E785 Hyperlipidemia, unspecified: Secondary | ICD-10-CM | POA: Diagnosis not present

## 2022-04-27 DIAGNOSIS — I10 Essential (primary) hypertension: Secondary | ICD-10-CM | POA: Diagnosis not present

## 2022-07-28 DIAGNOSIS — Z9989 Dependence on other enabling machines and devices: Secondary | ICD-10-CM | POA: Diagnosis not present

## 2022-07-28 DIAGNOSIS — L0390 Cellulitis, unspecified: Secondary | ICD-10-CM | POA: Diagnosis not present

## 2022-07-28 DIAGNOSIS — Z23 Encounter for immunization: Secondary | ICD-10-CM | POA: Diagnosis not present

## 2022-07-28 DIAGNOSIS — I10 Essential (primary) hypertension: Secondary | ICD-10-CM | POA: Diagnosis not present

## 2022-07-28 DIAGNOSIS — R262 Difficulty in walking, not elsewhere classified: Secondary | ICD-10-CM | POA: Diagnosis not present

## 2022-07-28 DIAGNOSIS — G14 Postpolio syndrome: Secondary | ICD-10-CM | POA: Diagnosis not present

## 2022-09-08 ENCOUNTER — Ambulatory Visit (HOSPITAL_COMMUNITY)
Admission: EM | Admit: 2022-09-08 | Discharge: 2022-09-08 | Disposition: A | Discharge status: Home / Self Care | Payer: Medicare HMO | Attending: Internal Medicine | Admitting: Internal Medicine

## 2022-09-08 ENCOUNTER — Encounter (HOSPITAL_COMMUNITY): Payer: Self-pay | Admitting: Emergency Medicine

## 2022-09-08 DIAGNOSIS — L739 Follicular disorder, unspecified: Secondary | ICD-10-CM

## 2022-09-08 MED ORDER — AMOXICILLIN-POT CLAVULANATE 875-125 MG PO TABS: 1.0000 | ORAL_TABLET | Freq: Two times a day (BID) | ORAL | 0 refills | Status: AC

## 2022-09-08 MED ORDER — MICONAZOLE NITRATE 2 % EX AERP: INHALATION_SPRAY | CUTANEOUS | 0 refills | Status: AC

## 2022-09-08 MED ORDER — IBUPROFEN 600 MG PO TABS: 600.0000 mg | ORAL_TABLET | Freq: Four times a day (QID) | ORAL | 0 refills | Status: AC | PRN

## 2022-09-08 NOTE — ED Provider Notes (Addendum)
[facility]    CSN: [id number] Arrival date & time: 09/08/22  1322      History   Chief Complaint Chief Complaint  Patient presents with   Abscess    HPI Krystal Li is a 57 y.o. female comes to the urgent care with complaints of painful suprapubic and left groin swelling of about 1 month duration.  Patient says symptoms has been persistent and is associated with some foul-smelling discharge from the left groin and left thigh area.  Patient denies any fever or chills.  She does not shave regularly.  Swelling in the left groin region has a location discharged pus.  No fever or chills.  No surrounding redness.Marland Kitchen   HPI  Past Medical History:  Diagnosis Date   Hypertension    Polio     There are no problems to display for this patient.   Past Surgical History:  Procedure Laterality Date   LEG SURGERY     WRIST SURGERY      OB History   No obstetric history on file.      Home Medications    Prior to Admission medications   Medication Sig Start Date End Date Taking? Authorizing Provider  amoxicillin-clavulanate (AUGMENTIN) 875-125 MG tablet Take 1 tablet by mouth every 12 (twelve) hours for 7 days. 09/08/22 09/15/22 Yes Hollye Pritt, Britta Mccreedy, MD  ibuprofen (ADVIL) 600 MG tablet Take 1 tablet (600 mg total) by mouth every 6 (six) hours as needed. 09/08/22  Yes Hattie Pine, Britta Mccreedy, MD  Miconazole Nitrate 2 % AERP Apply generously to the groin area twice daily until the wound heals.. 09/08/22  Yes Kamila Broda, Britta Mccreedy, MD  albuterol (PROVENTIL HFA;VENTOLIN HFA) 108 (90 BASE) MCG/ACT inhaler Inhale 1-2 puffs into the lungs every 6 (six) hours as needed for wheezing. 06/07/12   Charlynne Pander, MD  cholecalciferol (VITAMIN D) 1000 UNITS tablet Take 1,000 Units by mouth daily.    [provider]  diphenhydrAMINE (SOMINEX) 25 MG tablet Take 25 mg by mouth at bedtime as needed for itching or sleep.    [provider]  fluticasone (FLONASE) 50 MCG/ACT nasal spray  Place 1 spray into both nostrils daily. 03/24/18   Orland Mustard, MD  HYDROcodone-acetaminophen (NORCO/VICODIN) 5-325 MG tablet Take 1 tablet by mouth 2 (two) times daily.    [provider]  lisinopril (PRINIVIL,ZESTRIL) 20 MG tablet Take 20 mg by mouth daily.    [provider]  metoCLOPramide (REGLAN) 10 MG tablet Take 1 tablet (10 mg total) by mouth every 6 (six) hours as needed (nausea/headache). Patient not taking: No sig reported 06/07/12   Charlynne Pander, MD  metoprolol succinate (TOPROL-XL) 25 MG 24 hr tablet 1 tablet 08/09/19   [provider]  montelukast (SINGULAIR) 10 MG tablet Take 10 mg by mouth daily.    [provider]  ondansetron (ZOFRAN) 4 MG tablet Take 1 tablet (4 mg total) by mouth every 6 (six) hours. 05/19/15   Phillis Haggis, MD  pravastatin (PRAVACHOL) 40 MG tablet Take 40 mg by mouth daily.    [provider]    Family History No family history on file.  Social History Social History   Tobacco Use   Smoking status: Some Days   Smokeless tobacco: Never  Substance Use Topics   Alcohol use: No   Drug use: No     Allergies   Patient has no known allergies.   Review of Systems Review of Systems As per HPI  Physical Exam Triage Vital Signs ED Triage Vitals  Enc Vitals Group     BP 09/08/22 1501 (!) 165/100     Pulse Rate 09/08/22 1501 69     Resp 09/08/22 1501 20     Temp 09/08/22 1501 98.2 F (36.8 C)     Temp Source 09/08/22 1501 Oral     SpO2 09/08/22 1501 98 %     Weight --      Height --      Head Circumference --      Peak Flow --      Pain Score 09/08/22 1457 10     Pain Loc --      Pain Edu? --      Excl. in GC? --    No data found.  Updated Vital Signs BP (!) 165/100 (BP Location: Right Arm)   Pulse 69   Temp 98.2 F (36.8 C) (Oral)   Resp 20   LMP 11/21/2012   SpO2 98%   Visual Acuity Right Eye Distance:   Left Eye Distance:   Bilateral Distance:    Right Eye Near:    Left Eye Near:    Bilateral Near:     Physical Exam Vitals and nursing note reviewed. Exam conducted with a chaperone present.  Constitutional:      General: She is not in acute distress.    Appearance: She is not ill-appearing.  HENT:     Right Ear: Tympanic membrane normal.     Left Ear: Tympanic membrane normal.  Cardiovascular:     Rate and Rhythm: Normal rate and regular rhythm.     Pulses: Normal pulses.     Heart sounds: Normal heart sounds.  Pulmonary:     Effort: Pulmonary effort is normal.     Breath sounds: Normal breath sounds.  Genitourinary:    General: Normal vulva.     Comments: Indurated wound in the left groin region.  No fluctuance noted.  No overlying erythema.  Needle aspiration was negative for pus. Neurological:     Mental Status: She is alert.      UC Treatments / Results  Labs (all labs ordered are listed, but only abnormal results are displayed) Labs Reviewed - No data to display  EKG   Radiology No results found.  Procedures Procedures (including critical care time)  Medications Ordered in UC Medications - No data to display  Initial Impression / Assessment and Plan / UC Course  I have reviewed the triage vital signs and the nursing notes.  Pertinent labs & imaging results that were available during my care of the patient were reviewed by me and considered in my medical decision making (see chart for details).     1.  Folliculitis: Needle aspiration was negative for pus Augmentin has been prescribed for the patient Ibuprofen as needed for pain Patient is advised to do sitz bath's Return precautions given. Final Clinical Impressions(s) / UC Diagnoses   Final diagnoses:  Folliculitis     Discharge Instructions      Sitz bath's twice daily Please take antibiotics as directed and please complete the course of antibiotics Ibuprofen as needed for pain Please apply the powder to groin area after taking a shower and drying  off Return to urgent care if you have worsening symptoms.   ED Prescriptions     Medication Sig Dispense Auth. Provider   amoxicillin-clavulanate (AUGMENTIN) 875-125 MG tablet Take 1 tablet by mouth every 12 (twelve) hours for 7 days.  14 tablet Tresea Heine, Britta Mccreedy, MD   ibuprofen (ADVIL) 600 MG tablet Take 1 tablet (600 mg total) by mouth every 6 (six) hours as needed. 30 tablet Cache Decoursey, Britta Mccreedy, MD   Miconazole Nitrate 2 % AERP Apply generously to the groin area twice daily until the wound heals.. 113 g Cecilia Nishikawa, Britta Mccreedy, MD      PDMP not reviewed this encounter.   Merrilee Jansky, MD 09/09/22 1345    Merrilee Jansky, MD 09/09/22 [phone]

## 2022-09-08 NOTE — ED Triage Notes (Signed)
Pt reports has a boil and was seen a month ago at the doctor and was given medications for it opened up but now back and not any better. Reports on inner left thigh/groin area. Reports it painful. Reports has a smell to area.

## 2022-09-08 NOTE — Discharge Instructions (Addendum)
Sitz bath's twice daily Please take antibiotics as directed and please complete the course of antibiotics Ibuprofen as needed for pain Please apply the powder to groin area after taking a shower and drying off Return to urgent care if you have worsening symptoms.

## 2022-10-04 DIAGNOSIS — L02419 Cutaneous abscess of limb, unspecified: Secondary | ICD-10-CM | POA: Diagnosis not present

## 2022-10-04 DIAGNOSIS — Z9989 Dependence on other enabling machines and devices: Secondary | ICD-10-CM | POA: Diagnosis not present

## 2022-10-04 DIAGNOSIS — I10 Essential (primary) hypertension: Secondary | ICD-10-CM | POA: Diagnosis not present

## 2022-10-18 ENCOUNTER — Other Ambulatory Visit: Payer: Self-pay | Admitting: Physician Assistant

## 2022-10-18 DIAGNOSIS — Z1231 Encounter for screening mammogram for malignant neoplasm of breast: Secondary | ICD-10-CM

## 2022-11-21 ENCOUNTER — Ambulatory Visit
Admission: RE | Admit: 2022-11-21 | Discharge: 2022-11-21 | Disposition: A | Discharge status: Home / Self Care | Payer: Medicare HMO | Source: Ambulatory Visit | Attending: Physician Assistant | Admitting: Physician Assistant

## 2022-11-21 DIAGNOSIS — Z1231 Encounter for screening mammogram for malignant neoplasm of breast: Secondary | ICD-10-CM | POA: Diagnosis not present

## 2022-12-09 DIAGNOSIS — Z9989 Dependence on other enabling machines and devices: Secondary | ICD-10-CM | POA: Diagnosis not present

## 2022-12-09 DIAGNOSIS — R2681 Unsteadiness on feet: Secondary | ICD-10-CM | POA: Diagnosis not present

## 2022-12-09 DIAGNOSIS — Z7409 Other reduced mobility: Secondary | ICD-10-CM | POA: Diagnosis not present

## 2022-12-09 DIAGNOSIS — E785 Hyperlipidemia, unspecified: Secondary | ICD-10-CM | POA: Diagnosis not present

## 2022-12-09 DIAGNOSIS — M160 Bilateral primary osteoarthritis of hip: Secondary | ICD-10-CM | POA: Diagnosis not present

## 2022-12-09 DIAGNOSIS — Z1331 Encounter for screening for depression: Secondary | ICD-10-CM | POA: Diagnosis not present

## 2022-12-09 DIAGNOSIS — Z0000 Encounter for general adult medical examination without abnormal findings: Secondary | ICD-10-CM | POA: Diagnosis not present

## 2022-12-09 DIAGNOSIS — G14 Postpolio syndrome: Secondary | ICD-10-CM | POA: Diagnosis not present

## 2022-12-09 DIAGNOSIS — I10 Essential (primary) hypertension: Secondary | ICD-10-CM | POA: Diagnosis not present

## 2022-12-09 DIAGNOSIS — Z23 Encounter for immunization: Secondary | ICD-10-CM | POA: Diagnosis not present

## 2022-12-09 DIAGNOSIS — M170 Bilateral primary osteoarthritis of knee: Secondary | ICD-10-CM | POA: Diagnosis not present

## 2022-12-23 DIAGNOSIS — E785 Hyperlipidemia, unspecified: Secondary | ICD-10-CM | POA: Diagnosis not present

## 2022-12-23 DIAGNOSIS — Z791 Long term (current) use of non-steroidal anti-inflammatories (NSAID): Secondary | ICD-10-CM | POA: Diagnosis not present

## 2022-12-23 DIAGNOSIS — Z556 Problems related to health literacy: Secondary | ICD-10-CM | POA: Diagnosis not present

## 2022-12-23 DIAGNOSIS — M21371 Foot drop, right foot: Secondary | ICD-10-CM | POA: Diagnosis not present

## 2022-12-23 DIAGNOSIS — G14 Postpolio syndrome: Secondary | ICD-10-CM | POA: Diagnosis not present

## 2022-12-23 DIAGNOSIS — J301 Allergic rhinitis due to pollen: Secondary | ICD-10-CM | POA: Diagnosis not present

## 2022-12-23 DIAGNOSIS — I10 Essential (primary) hypertension: Secondary | ICD-10-CM | POA: Diagnosis not present

## 2022-12-23 DIAGNOSIS — Z9181 History of falling: Secondary | ICD-10-CM | POA: Diagnosis not present

## 2022-12-29 DIAGNOSIS — Z791 Long term (current) use of non-steroidal anti-inflammatories (NSAID): Secondary | ICD-10-CM | POA: Diagnosis not present

## 2022-12-29 DIAGNOSIS — E785 Hyperlipidemia, unspecified: Secondary | ICD-10-CM | POA: Diagnosis not present

## 2022-12-29 DIAGNOSIS — M21371 Foot drop, right foot: Secondary | ICD-10-CM | POA: Diagnosis not present

## 2022-12-29 DIAGNOSIS — J301 Allergic rhinitis due to pollen: Secondary | ICD-10-CM | POA: Diagnosis not present

## 2022-12-29 DIAGNOSIS — Z556 Problems related to health literacy: Secondary | ICD-10-CM | POA: Diagnosis not present

## 2022-12-29 DIAGNOSIS — Z9181 History of falling: Secondary | ICD-10-CM | POA: Diagnosis not present

## 2022-12-29 DIAGNOSIS — G14 Postpolio syndrome: Secondary | ICD-10-CM | POA: Diagnosis not present

## 2022-12-29 DIAGNOSIS — I10 Essential (primary) hypertension: Secondary | ICD-10-CM | POA: Diagnosis not present

## 2023-01-02 DIAGNOSIS — I10 Essential (primary) hypertension: Secondary | ICD-10-CM | POA: Diagnosis not present

## 2023-01-02 DIAGNOSIS — Z23 Encounter for immunization: Secondary | ICD-10-CM | POA: Diagnosis not present

## 2023-01-02 DIAGNOSIS — R262 Difficulty in walking, not elsewhere classified: Secondary | ICD-10-CM | POA: Diagnosis not present

## 2023-01-02 DIAGNOSIS — G14 Postpolio syndrome: Secondary | ICD-10-CM | POA: Diagnosis not present

## 2023-01-02 DIAGNOSIS — M160 Bilateral primary osteoarthritis of hip: Secondary | ICD-10-CM | POA: Diagnosis not present

## 2023-01-05 DIAGNOSIS — E785 Hyperlipidemia, unspecified: Secondary | ICD-10-CM | POA: Diagnosis not present

## 2023-01-05 DIAGNOSIS — Z791 Long term (current) use of non-steroidal anti-inflammatories (NSAID): Secondary | ICD-10-CM | POA: Diagnosis not present

## 2023-01-05 DIAGNOSIS — Z9181 History of falling: Secondary | ICD-10-CM | POA: Diagnosis not present

## 2023-01-05 DIAGNOSIS — G14 Postpolio syndrome: Secondary | ICD-10-CM | POA: Diagnosis not present

## 2023-01-05 DIAGNOSIS — I10 Essential (primary) hypertension: Secondary | ICD-10-CM | POA: Diagnosis not present

## 2023-01-05 DIAGNOSIS — Z556 Problems related to health literacy: Secondary | ICD-10-CM | POA: Diagnosis not present

## 2023-01-05 DIAGNOSIS — J301 Allergic rhinitis due to pollen: Secondary | ICD-10-CM | POA: Diagnosis not present

## 2023-01-05 DIAGNOSIS — M21371 Foot drop, right foot: Secondary | ICD-10-CM | POA: Diagnosis not present

## 2023-01-11 DIAGNOSIS — E785 Hyperlipidemia, unspecified: Secondary | ICD-10-CM | POA: Diagnosis not present

## 2023-01-11 DIAGNOSIS — Z791 Long term (current) use of non-steroidal anti-inflammatories (NSAID): Secondary | ICD-10-CM | POA: Diagnosis not present

## 2023-01-11 DIAGNOSIS — M21371 Foot drop, right foot: Secondary | ICD-10-CM | POA: Diagnosis not present

## 2023-01-11 DIAGNOSIS — G14 Postpolio syndrome: Secondary | ICD-10-CM | POA: Diagnosis not present

## 2023-01-11 DIAGNOSIS — Z9181 History of falling: Secondary | ICD-10-CM | POA: Diagnosis not present

## 2023-01-11 DIAGNOSIS — Z556 Problems related to health literacy: Secondary | ICD-10-CM | POA: Diagnosis not present

## 2023-01-11 DIAGNOSIS — J301 Allergic rhinitis due to pollen: Secondary | ICD-10-CM | POA: Diagnosis not present

## 2023-01-11 DIAGNOSIS — I10 Essential (primary) hypertension: Secondary | ICD-10-CM | POA: Diagnosis not present

## 2023-01-13 DIAGNOSIS — Z556 Problems related to health literacy: Secondary | ICD-10-CM | POA: Diagnosis not present

## 2023-01-13 DIAGNOSIS — E785 Hyperlipidemia, unspecified: Secondary | ICD-10-CM | POA: Diagnosis not present

## 2023-01-13 DIAGNOSIS — Z9181 History of falling: Secondary | ICD-10-CM | POA: Diagnosis not present

## 2023-01-13 DIAGNOSIS — J301 Allergic rhinitis due to pollen: Secondary | ICD-10-CM | POA: Diagnosis not present

## 2023-01-13 DIAGNOSIS — M21371 Foot drop, right foot: Secondary | ICD-10-CM | POA: Diagnosis not present

## 2023-01-13 DIAGNOSIS — I10 Essential (primary) hypertension: Secondary | ICD-10-CM | POA: Diagnosis not present

## 2023-01-13 DIAGNOSIS — G14 Postpolio syndrome: Secondary | ICD-10-CM | POA: Diagnosis not present

## 2023-01-13 DIAGNOSIS — Z791 Long term (current) use of non-steroidal anti-inflammatories (NSAID): Secondary | ICD-10-CM | POA: Diagnosis not present

## 2023-01-17 DIAGNOSIS — M21371 Foot drop, right foot: Secondary | ICD-10-CM | POA: Diagnosis not present

## 2023-01-17 DIAGNOSIS — Z556 Problems related to health literacy: Secondary | ICD-10-CM | POA: Diagnosis not present

## 2023-01-17 DIAGNOSIS — Z9181 History of falling: Secondary | ICD-10-CM | POA: Diagnosis not present

## 2023-01-17 DIAGNOSIS — G14 Postpolio syndrome: Secondary | ICD-10-CM | POA: Diagnosis not present

## 2023-01-17 DIAGNOSIS — E785 Hyperlipidemia, unspecified: Secondary | ICD-10-CM | POA: Diagnosis not present

## 2023-01-17 DIAGNOSIS — I10 Essential (primary) hypertension: Secondary | ICD-10-CM | POA: Diagnosis not present

## 2023-01-17 DIAGNOSIS — Z791 Long term (current) use of non-steroidal anti-inflammatories (NSAID): Secondary | ICD-10-CM | POA: Diagnosis not present

## 2023-01-17 DIAGNOSIS — J301 Allergic rhinitis due to pollen: Secondary | ICD-10-CM | POA: Diagnosis not present

## 2023-01-18 DIAGNOSIS — Z9181 History of falling: Secondary | ICD-10-CM | POA: Diagnosis not present

## 2023-01-18 DIAGNOSIS — E785 Hyperlipidemia, unspecified: Secondary | ICD-10-CM | POA: Diagnosis not present

## 2023-01-18 DIAGNOSIS — J301 Allergic rhinitis due to pollen: Secondary | ICD-10-CM | POA: Diagnosis not present

## 2023-01-18 DIAGNOSIS — G14 Postpolio syndrome: Secondary | ICD-10-CM | POA: Diagnosis not present

## 2023-01-18 DIAGNOSIS — I10 Essential (primary) hypertension: Secondary | ICD-10-CM | POA: Diagnosis not present

## 2023-01-18 DIAGNOSIS — Z791 Long term (current) use of non-steroidal anti-inflammatories (NSAID): Secondary | ICD-10-CM | POA: Diagnosis not present

## 2023-01-18 DIAGNOSIS — Z556 Problems related to health literacy: Secondary | ICD-10-CM | POA: Diagnosis not present

## 2023-01-18 DIAGNOSIS — M21371 Foot drop, right foot: Secondary | ICD-10-CM | POA: Diagnosis not present

## 2023-01-22 DIAGNOSIS — Z791 Long term (current) use of non-steroidal anti-inflammatories (NSAID): Secondary | ICD-10-CM | POA: Diagnosis not present

## 2023-01-22 DIAGNOSIS — G14 Postpolio syndrome: Secondary | ICD-10-CM | POA: Diagnosis not present

## 2023-01-22 DIAGNOSIS — Z9181 History of falling: Secondary | ICD-10-CM | POA: Diagnosis not present

## 2023-01-22 DIAGNOSIS — J301 Allergic rhinitis due to pollen: Secondary | ICD-10-CM | POA: Diagnosis not present

## 2023-01-22 DIAGNOSIS — M21371 Foot drop, right foot: Secondary | ICD-10-CM | POA: Diagnosis not present

## 2023-01-22 DIAGNOSIS — I10 Essential (primary) hypertension: Secondary | ICD-10-CM | POA: Diagnosis not present

## 2023-01-22 DIAGNOSIS — E785 Hyperlipidemia, unspecified: Secondary | ICD-10-CM | POA: Diagnosis not present

## 2023-01-22 DIAGNOSIS — Z556 Problems related to health literacy: Secondary | ICD-10-CM | POA: Diagnosis not present

## 2023-01-26 DIAGNOSIS — I10 Essential (primary) hypertension: Secondary | ICD-10-CM | POA: Diagnosis not present

## 2023-01-26 DIAGNOSIS — J301 Allergic rhinitis due to pollen: Secondary | ICD-10-CM | POA: Diagnosis not present

## 2023-01-26 DIAGNOSIS — G14 Postpolio syndrome: Secondary | ICD-10-CM | POA: Diagnosis not present

## 2023-01-26 DIAGNOSIS — M21371 Foot drop, right foot: Secondary | ICD-10-CM | POA: Diagnosis not present

## 2023-01-26 DIAGNOSIS — Z791 Long term (current) use of non-steroidal anti-inflammatories (NSAID): Secondary | ICD-10-CM | POA: Diagnosis not present

## 2023-01-26 DIAGNOSIS — Z9181 History of falling: Secondary | ICD-10-CM | POA: Diagnosis not present

## 2023-01-26 DIAGNOSIS — E785 Hyperlipidemia, unspecified: Secondary | ICD-10-CM | POA: Diagnosis not present

## 2023-01-26 DIAGNOSIS — Z556 Problems related to health literacy: Secondary | ICD-10-CM | POA: Diagnosis not present

## 2023-01-27 DIAGNOSIS — Z556 Problems related to health literacy: Secondary | ICD-10-CM | POA: Diagnosis not present

## 2023-01-27 DIAGNOSIS — M21371 Foot drop, right foot: Secondary | ICD-10-CM | POA: Diagnosis not present

## 2023-01-27 DIAGNOSIS — J301 Allergic rhinitis due to pollen: Secondary | ICD-10-CM | POA: Diagnosis not present

## 2023-01-27 DIAGNOSIS — Z791 Long term (current) use of non-steroidal anti-inflammatories (NSAID): Secondary | ICD-10-CM | POA: Diagnosis not present

## 2023-01-27 DIAGNOSIS — I10 Essential (primary) hypertension: Secondary | ICD-10-CM | POA: Diagnosis not present

## 2023-01-27 DIAGNOSIS — G14 Postpolio syndrome: Secondary | ICD-10-CM | POA: Diagnosis not present

## 2023-01-27 DIAGNOSIS — E785 Hyperlipidemia, unspecified: Secondary | ICD-10-CM | POA: Diagnosis not present

## 2023-01-27 DIAGNOSIS — Z9181 History of falling: Secondary | ICD-10-CM | POA: Diagnosis not present

## 2023-01-30 DIAGNOSIS — J301 Allergic rhinitis due to pollen: Secondary | ICD-10-CM | POA: Diagnosis not present

## 2023-01-30 DIAGNOSIS — Z9181 History of falling: Secondary | ICD-10-CM | POA: Diagnosis not present

## 2023-01-30 DIAGNOSIS — G14 Postpolio syndrome: Secondary | ICD-10-CM | POA: Diagnosis not present

## 2023-01-30 DIAGNOSIS — I10 Essential (primary) hypertension: Secondary | ICD-10-CM | POA: Diagnosis not present

## 2023-01-30 DIAGNOSIS — E785 Hyperlipidemia, unspecified: Secondary | ICD-10-CM | POA: Diagnosis not present

## 2023-01-30 DIAGNOSIS — Z556 Problems related to health literacy: Secondary | ICD-10-CM | POA: Diagnosis not present

## 2023-01-30 DIAGNOSIS — Z791 Long term (current) use of non-steroidal anti-inflammatories (NSAID): Secondary | ICD-10-CM | POA: Diagnosis not present

## 2023-01-30 DIAGNOSIS — M21371 Foot drop, right foot: Secondary | ICD-10-CM | POA: Diagnosis not present

## 2023-02-01 DIAGNOSIS — Z9181 History of falling: Secondary | ICD-10-CM | POA: Diagnosis not present

## 2023-02-01 DIAGNOSIS — Z556 Problems related to health literacy: Secondary | ICD-10-CM | POA: Diagnosis not present

## 2023-02-01 DIAGNOSIS — J301 Allergic rhinitis due to pollen: Secondary | ICD-10-CM | POA: Diagnosis not present

## 2023-02-01 DIAGNOSIS — M21371 Foot drop, right foot: Secondary | ICD-10-CM | POA: Diagnosis not present

## 2023-02-01 DIAGNOSIS — I10 Essential (primary) hypertension: Secondary | ICD-10-CM | POA: Diagnosis not present

## 2023-02-01 DIAGNOSIS — Z791 Long term (current) use of non-steroidal anti-inflammatories (NSAID): Secondary | ICD-10-CM | POA: Diagnosis not present

## 2023-02-01 DIAGNOSIS — E785 Hyperlipidemia, unspecified: Secondary | ICD-10-CM | POA: Diagnosis not present

## 2023-02-01 DIAGNOSIS — G14 Postpolio syndrome: Secondary | ICD-10-CM | POA: Diagnosis not present

## 2023-02-09 DIAGNOSIS — Z791 Long term (current) use of non-steroidal anti-inflammatories (NSAID): Secondary | ICD-10-CM | POA: Diagnosis not present

## 2023-02-09 DIAGNOSIS — M21371 Foot drop, right foot: Secondary | ICD-10-CM | POA: Diagnosis not present

## 2023-02-09 DIAGNOSIS — Z556 Problems related to health literacy: Secondary | ICD-10-CM | POA: Diagnosis not present

## 2023-02-09 DIAGNOSIS — J301 Allergic rhinitis due to pollen: Secondary | ICD-10-CM | POA: Diagnosis not present

## 2023-02-09 DIAGNOSIS — Z9181 History of falling: Secondary | ICD-10-CM | POA: Diagnosis not present

## 2023-02-09 DIAGNOSIS — E785 Hyperlipidemia, unspecified: Secondary | ICD-10-CM | POA: Diagnosis not present

## 2023-02-09 DIAGNOSIS — I10 Essential (primary) hypertension: Secondary | ICD-10-CM | POA: Diagnosis not present

## 2023-02-09 DIAGNOSIS — G14 Postpolio syndrome: Secondary | ICD-10-CM | POA: Diagnosis not present

## 2023-02-16 DIAGNOSIS — I10 Essential (primary) hypertension: Secondary | ICD-10-CM | POA: Diagnosis not present

## 2023-02-16 DIAGNOSIS — M21371 Foot drop, right foot: Secondary | ICD-10-CM | POA: Diagnosis not present

## 2023-02-16 DIAGNOSIS — Z556 Problems related to health literacy: Secondary | ICD-10-CM | POA: Diagnosis not present

## 2023-02-16 DIAGNOSIS — E785 Hyperlipidemia, unspecified: Secondary | ICD-10-CM | POA: Diagnosis not present

## 2023-02-16 DIAGNOSIS — Z791 Long term (current) use of non-steroidal anti-inflammatories (NSAID): Secondary | ICD-10-CM | POA: Diagnosis not present

## 2023-02-16 DIAGNOSIS — J301 Allergic rhinitis due to pollen: Secondary | ICD-10-CM | POA: Diagnosis not present

## 2023-02-16 DIAGNOSIS — Z9181 History of falling: Secondary | ICD-10-CM | POA: Diagnosis not present

## 2023-02-16 DIAGNOSIS — G14 Postpolio syndrome: Secondary | ICD-10-CM | POA: Diagnosis not present

## 2023-02-27 DIAGNOSIS — Z87891 Personal history of nicotine dependence: Secondary | ICD-10-CM | POA: Diagnosis not present

## 2023-02-27 DIAGNOSIS — R2681 Unsteadiness on feet: Secondary | ICD-10-CM | POA: Diagnosis not present

## 2023-02-27 DIAGNOSIS — I10 Essential (primary) hypertension: Secondary | ICD-10-CM | POA: Diagnosis not present

## 2023-02-27 DIAGNOSIS — G14 Postpolio syndrome: Secondary | ICD-10-CM | POA: Diagnosis not present

## 2023-02-27 DIAGNOSIS — Z9989 Dependence on other enabling machines and devices: Secondary | ICD-10-CM | POA: Diagnosis not present

## 2023-04-25 DIAGNOSIS — J069 Acute upper respiratory infection, unspecified: Secondary | ICD-10-CM | POA: Diagnosis not present

## 2023-04-25 DIAGNOSIS — J019 Acute sinusitis, unspecified: Secondary | ICD-10-CM | POA: Diagnosis not present

## 2023-06-04 ENCOUNTER — Other Ambulatory Visit: Payer: Self-pay

## 2023-06-04 ENCOUNTER — Encounter (HOSPITAL_COMMUNITY): Payer: Self-pay

## 2023-06-04 ENCOUNTER — Emergency Department (HOSPITAL_COMMUNITY)
Admission: EM | Admit: 2023-06-04 | Discharge: 2023-06-04 | Disposition: A | Discharge status: Home / Self Care | Payer: Medicare HMO | Attending: Emergency Medicine | Admitting: Emergency Medicine

## 2023-06-04 ENCOUNTER — Emergency Department (HOSPITAL_COMMUNITY): Payer: Medicare HMO

## 2023-06-04 DIAGNOSIS — F17200 Nicotine dependence, unspecified, uncomplicated: Secondary | ICD-10-CM | POA: Diagnosis not present

## 2023-06-04 DIAGNOSIS — K449 Diaphragmatic hernia without obstruction or gangrene: Secondary | ICD-10-CM | POA: Diagnosis not present

## 2023-06-04 DIAGNOSIS — I10 Essential (primary) hypertension: Secondary | ICD-10-CM | POA: Diagnosis not present

## 2023-06-04 DIAGNOSIS — M545 Low back pain, unspecified: Secondary | ICD-10-CM | POA: Diagnosis not present

## 2023-06-04 DIAGNOSIS — N200 Calculus of kidney: Secondary | ICD-10-CM | POA: Diagnosis not present

## 2023-06-04 DIAGNOSIS — R109 Unspecified abdominal pain: Secondary | ICD-10-CM | POA: Diagnosis not present

## 2023-06-04 DIAGNOSIS — M5459 Other low back pain: Secondary | ICD-10-CM | POA: Diagnosis not present

## 2023-06-04 DIAGNOSIS — K2970 Gastritis, unspecified, without bleeding: Secondary | ICD-10-CM | POA: Diagnosis not present

## 2023-06-04 LAB — CBC WITH DIFFERENTIAL/PLATELET
Abs Immature Granulocytes: 0.01 10*3/uL (ref 0.00–0.07)
Basophils Absolute: 0 10*3/uL (ref 0.0–0.1)
Basophils Relative: 1 %
Eosinophils Absolute: 0 10*3/uL (ref 0.0–0.5)
Eosinophils Relative: 0 %
HCT: 37 % (ref 36.0–46.0)
Hemoglobin: 12.8 g/dL (ref 12.0–15.0)
Immature Granulocytes: 0 %
Lymphocytes Relative: 30 %
Lymphs Abs: 1.7 10*3/uL (ref 0.7–4.0)
MCH: 32 pg (ref 26.0–34.0)
MCHC: 34.6 g/dL (ref 30.0–36.0)
MCV: 92.5 fL (ref 80.0–100.0)
Monocytes Absolute: 0.4 10*3/uL (ref 0.1–1.0)
Monocytes Relative: 6 %
Neutro Abs: 3.5 10*3/uL (ref 1.7–7.7)
Neutrophils Relative %: 63 %
Platelets: 287 10*3/uL (ref 150–400)
RBC: 4 MIL/uL (ref 3.87–5.11)
RDW: 11.8 % (ref 11.5–15.5)
WBC: 5.7 10*3/uL (ref 4.0–10.5)
nRBC: 0 % (ref 0.0–0.2)

## 2023-06-04 LAB — COMPREHENSIVE METABOLIC PANEL
ALT: 30 U/L (ref 0–44)
AST: 25 U/L (ref 15–41)
Albumin: 4 g/dL (ref 3.5–5.0)
Alkaline Phosphatase: 85 U/L (ref 38–126)
Anion gap: 13 (ref 5–15)
BUN: 25 mg/dL — ABNORMAL HIGH (ref 6–20)
CO2: 23 mmol/L (ref 22–32)
Calcium: 9.7 mg/dL (ref 8.9–10.3)
Chloride: 104 mmol/L (ref 98–111)
Creatinine, Ser: 0.82 mg/dL (ref 0.44–1.00)
GFR, Estimated: >60 mL/min (ref >60)
Glucose, Bld: 147 mg/dL — ABNORMAL HIGH (ref 70–99)
Potassium: 3.2 mmol/L — ABNORMAL LOW (ref 3.5–5.1)
Sodium: 140 mmol/L (ref 135–145)
Total Bilirubin: 0.8 mg/dL (ref 0.0–1.2)
Total Protein: 7.6 g/dL (ref 6.5–8.1)

## 2023-06-04 LAB — URINALYSIS, ROUTINE W REFLEX MICROSCOPIC
Bilirubin Urine: NEGATIVE
Glucose, UA: NEGATIVE mg/dL
Hgb urine dipstick: NEGATIVE
Ketones, ur: NEGATIVE mg/dL
Nitrite: NEGATIVE
Protein, ur: NEGATIVE mg/dL
Specific Gravity, Urine: 1.019 (ref 1.005–1.030)
pH: 5 (ref 5.0–8.0)

## 2023-06-04 LAB — LIPASE, BLOOD: Lipase: 30 U/L (ref 11–51)

## 2023-06-04 MED ORDER — LIDOCAINE 5 % EX PTCH: 1.0000 | MEDICATED_PATCH | CUTANEOUS | 0 refills | Status: AC

## 2023-06-04 MED ORDER — METHOCARBAMOL 500 MG PO TABS: 500.0000 mg | ORAL_TABLET | Freq: Three times a day (TID) | ORAL | 0 refills | Status: AC | PRN

## 2023-06-04 MED ORDER — NAPROXEN 500 MG PO TABS: 500.0000 mg | ORAL_TABLET | Freq: Two times a day (BID) | ORAL | 0 refills | Status: AC

## 2023-06-04 NOTE — ED Triage Notes (Signed)
 Pt BIB [facility] from home c/o right flank pain and right lower back pain x2 weeks that has gotten worse and increased pain with motion.

## 2023-06-04 NOTE — Discharge Instructions (Signed)
 You were evaluated in the Emergency Department and after careful evaluation, we did not find any emergent condition requiring admission or further testing in the hospital.  Your exam/testing today is overall reassuring.  Your pain is likely due to muscular strain or spasm to your lower back.  Recommend use of the Naprosyn twice daily for pain.  Use the numbing patches daily as well.  For more significant pain keeping him from sleeping you can use the Robaxin muscle relaxer provided.  Please return to the Emergency Department if you experience any worsening of your condition.   Thank you for allowing [place] to be a part of your care.

## 2023-06-04 NOTE — ED Provider Triage Note (Signed)
 Emergency Medicine Provider Triage Evaluation Note  Krystal Li , a 58 y.o. female  was evaluated in triage.  Pt complains of right flank pain.  Patient has had right flank pain right lower back pain for couple weeks that is worsened.  It is worse with movement.  Denies any urinary symptoms.  No weakness or numbness or radiating pain.  No difficulty going to the bathroom.  No abdominal pain.  Review of Systems  Positive: Back pain, flank pain Negative: Abdominal pain, vomiting  Physical Exam  BP 127/82   Pulse (!) 102   Temp 99 F (37.2 C)   Resp 18   Ht 5\' 2"  (1.575 m)   Wt 72 kg   LMP 11/21/2012   SpO2 99%   BMI 29.03 kg/m  Gen:   Awake, no distress   Resp:  Normal effort  MSK:   Moves extremities without difficulty  Other:  Mild right CVA tenderness.  Normal neurologic exam.  Abdominal exam benign.  Medical Decision Making  Medically screening exam initiated at 7:44 PM.  Appropriate orders placed.  Krystal Li was informed that the remainder of the evaluation will be completed by another provider, this initial triage assessment does not replace that evaluation, and the importance of remaining in the ED until their evaluation is complete.  Patient is stable.  She has right flank pain with fever.  Benign abdominal exam.  Will rule out stone, UTI.   Laurence Spates, MD 06/04/23 [phone]

## 2023-06-04 NOTE — ED Provider Notes (Signed)
 [facility] Emergency Department Provider Note MRN:  [id number]  Arrival date & time: 06/04/23     Chief Complaint   Flank Pain   History of Present Illness   Krystal Li is a 58 y.o. year-old female with a history of hypertension, polio presenting to the ED with chief complaint of flank pain.  Patient has been having persistent right lower back and right flank pain for the past week or two.  Started more in the lower back and mild and has become more worse and worse.  Notices it the most when trying to twist or turn to get up and out of bed.  She explains that she has a history of polio and gets around with a walker at baseline.  She denies any new numbness or weakness to the arms or legs, no bowel or bladder dysfunction.  Denies fever, no chest pain or shortness of breath, no nausea or vomiting.  Pain not going away, wanted to make sure it was not anything more serious.  Review of Systems  A thorough review of systems was obtained and all systems are negative except as noted in the HPI and PMH.   Patient's Health History    Past Medical History:  Diagnosis Date   Hypertension    Polio     Past Surgical History:  Procedure Laterality Date   LEG SURGERY     WRIST SURGERY      History reviewed. No pertinent family history.  Social History   Socioeconomic History   Marital status: Single    Spouse name: Not on file   Number of children: Not on file   Years of education: Not on file   Highest education level: Not on file  Occupational History   Not on file  Tobacco Use   Smoking status: Some Days   Smokeless tobacco: Never  Substance and Sexual Activity   Alcohol use: No   Drug use: No   Sexual activity: Not on file  Other Topics Concern   Not on file  Social History Narrative   Not on file   Social Drivers of Health   Financial Resource Strain: Not on file  Food Insecurity: Not on file  Transportation Needs: Not on file  Physical  Activity: Not on file  Stress: Not on file  Social Connections: Not on file  Intimate Partner Violence: Not on file     Physical Exam   Vitals:   06/04/23 1840 06/04/23 1841  BP: 127/87 127/82  Pulse: 100 (!) 102  Resp: 18 18  Temp: 99 F (37.2 C) 99 F (37.2 C)  SpO2: 98% 99%    CONSTITUTIONAL: Well-appearing, NAD NEURO/PSYCH:  Alert and oriented x 3, no focal deficits EYES:  eyes equal and reactive ENT/NECK:  no LAD, no JVD CARDIO: Regular rate, well-perfused, normal S1 and S2 PULM:  CTAB no wheezing or rhonchi GI/GU:  non-distended, non-tender MSK/SPINE:  No gross deformities, no edema SKIN:  no rash, atraumatic   *Additional and/or pertinent findings included in MDM below  Diagnostic and Interventional Summary    EKG Interpretation Date/Time:    Ventricular Rate:    PR Interval:    QRS Duration:    QT Interval:    QTC Calculation:   R Axis:      Text Interpretation:         Labs Reviewed  URINALYSIS, ROUTINE W REFLEX MICROSCOPIC - Abnormal; Notable for the following components:      Result Value  APPearance HAZY (*)    Leukocytes,Ua MODERATE (*)    Bacteria, UA RARE (*)    All other components within normal limits  COMPREHENSIVE METABOLIC PANEL - Abnormal; Notable for the following components:   Potassium 3.2 (*)    Glucose, Bld 147 (*)    BUN 25 (*)    All other components within normal limits  LIPASE, BLOOD  CBC WITH DIFFERENTIAL/PLATELET    CT Renal Stone Study  Final Result      Medications - No data to display   Procedures  /  Critical Care Procedures  ED Course and Medical Decision Making  Initial Impression and Ddx The description of the pain seems most consistent with MSK etiology, likely muscle strain or spasm.  She does not have any signs or symptoms of myelopathy nor recent trauma and so there is no indication for imaging of the spine.  She is having flank pain and so kidney stone is a consideration as well.  Pyelonephritis also  considered.  Less likely but also considering appendicitis, right-sided diverticulitis.  Past medical/surgical history that increases complexity of ED encounter: Polio  Interpretation of Diagnostics I personally reviewed the laboratory assessment and my interpretation is as follows: No significant blood count or electrolyte disturbance, no leukocytosis.  CT renal study is without acute pathology.  Nonobstructing stone noted.    Patient Reassessment and Ultimate Disposition/Management     Discharge  Patient management required discussion with the following services or consulting groups:  None  Complexity of Problems Addressed Acute illness or injury that poses threat of life of bodily function  Additional Data Reviewed and Analyzed Further history obtained from: Past medical history and medications listed in the EMR and Prior labs/imaging results  Additional Factors Impacting ED Encounter Risk Prescriptions  Elmer Sow. Pilar Plate, MD [facility] Emergency Medicine [facility] [email or web address]  Final Clinical Impressions(s) / ED Diagnoses     ICD-10-CM   1. Acute right-sided low back pain without sciatica  M54.50       ED Discharge Orders          Ordered    naproxen (NAPROSYN) 500 MG tablet  2 times daily        06/04/23 2318    methocarbamol (ROBAXIN) 500 MG tablet  Every 8 hours PRN        06/04/23 2318    lidocaine (LIDODERM) 5 %  Every 24 hours        06/04/23 2318             Discharge Instructions Discussed with and Provided to Patient:    Discharge Instructions      You were evaluated in the Emergency Department and after careful evaluation, we did not find any emergent condition requiring admission or further testing in the hospital.  Your exam/testing today is overall reassuring.  Your pain is likely due to muscular strain or spasm to your lower back.  Recommend use of the Naprosyn twice daily for pain.  Use the numbing patches daily  as well.  For more significant pain keeping him from sleeping you can use the Robaxin muscle relaxer provided.  Please return to the Emergency Department if you experience any worsening of your condition.   Thank you for allowing [place] to be a part of your care.      Sabas Sous, MD 06/04/23 2322

## 2023-06-04 NOTE — ED Notes (Signed)
 Questions and concerns addressed. Discharge teaching completed.   Prescriptions reviewed and pharmacy verified.   Pt discharged in wheelchair to lobby. [employer] taxi contacted and taxi voucher provided.

## 2023-06-16 DIAGNOSIS — M170 Bilateral primary osteoarthritis of knee: Secondary | ICD-10-CM | POA: Diagnosis not present

## 2023-06-16 DIAGNOSIS — I10 Essential (primary) hypertension: Secondary | ICD-10-CM | POA: Diagnosis not present

## 2023-06-16 DIAGNOSIS — G14 Postpolio syndrome: Secondary | ICD-10-CM | POA: Diagnosis not present

## 2023-06-16 DIAGNOSIS — N200 Calculus of kidney: Secondary | ICD-10-CM | POA: Diagnosis not present

## 2023-06-16 DIAGNOSIS — E785 Hyperlipidemia, unspecified: Secondary | ICD-10-CM | POA: Diagnosis not present

## 2023-06-16 DIAGNOSIS — R262 Difficulty in walking, not elsewhere classified: Secondary | ICD-10-CM | POA: Diagnosis not present

## 2023-08-07 DIAGNOSIS — Z87442 Personal history of urinary calculi: Secondary | ICD-10-CM | POA: Diagnosis not present

## 2023-08-07 DIAGNOSIS — N200 Calculus of kidney: Secondary | ICD-10-CM | POA: Diagnosis not present

## 2023-09-21 DIAGNOSIS — Z1331 Encounter for screening for depression: Secondary | ICD-10-CM | POA: Diagnosis not present

## 2023-09-21 DIAGNOSIS — I10 Essential (primary) hypertension: Secondary | ICD-10-CM | POA: Diagnosis not present

## 2023-09-21 DIAGNOSIS — R2681 Unsteadiness on feet: Secondary | ICD-10-CM | POA: Diagnosis not present

## 2023-09-21 DIAGNOSIS — M1990 Unspecified osteoarthritis, unspecified site: Secondary | ICD-10-CM | POA: Diagnosis not present

## 2023-09-21 DIAGNOSIS — A809 Acute poliomyelitis, unspecified: Secondary | ICD-10-CM | POA: Diagnosis not present

## 2023-10-31 ENCOUNTER — Other Ambulatory Visit: Payer: Self-pay | Admitting: Family

## 2023-10-31 DIAGNOSIS — Z1231 Encounter for screening mammogram for malignant neoplasm of breast: Secondary | ICD-10-CM

## 2023-11-23 ENCOUNTER — Ambulatory Visit

## 2023-12-25 ENCOUNTER — Ambulatory Visit
Admission: RE | Admit: 2023-12-25 | Discharge: 2023-12-25 | Disposition: A | Discharge status: Home / Self Care | Source: Ambulatory Visit | Attending: Family | Admitting: Family

## 2023-12-25 DIAGNOSIS — Z1231 Encounter for screening mammogram for malignant neoplasm of breast: Secondary | ICD-10-CM
